# Patient Record
Sex: Female | Born: 1970 | Race: White | Hispanic: No | Marital: Married | Smoking: Never smoker
Health system: Southern US, Community
[De-identification: ages and names within clinical notes are randomized; demographics above are authoritative.]

## PROBLEM LIST (undated history)

## (undated) DIAGNOSIS — R197 Diarrhea, unspecified: Secondary | ICD-10-CM

## (undated) DIAGNOSIS — D649 Anemia, unspecified: Secondary | ICD-10-CM

## (undated) DIAGNOSIS — R51 Headache: Secondary | ICD-10-CM

## (undated) DIAGNOSIS — F329 Major depressive disorder, single episode, unspecified: Secondary | ICD-10-CM

## (undated) DIAGNOSIS — G479 Sleep disorder, unspecified: Secondary | ICD-10-CM

## (undated) DIAGNOSIS — E039 Hypothyroidism, unspecified: Secondary | ICD-10-CM

## (undated) DIAGNOSIS — E538 Deficiency of other specified B group vitamins: Secondary | ICD-10-CM

## (undated) DIAGNOSIS — F32A Depression, unspecified: Secondary | ICD-10-CM

## (undated) DIAGNOSIS — F419 Anxiety disorder, unspecified: Secondary | ICD-10-CM

## (undated) DIAGNOSIS — R519 Headache, unspecified: Secondary | ICD-10-CM

## (undated) DIAGNOSIS — K589 Irritable bowel syndrome without diarrhea: Secondary | ICD-10-CM

## (undated) DIAGNOSIS — K219 Gastro-esophageal reflux disease without esophagitis: Secondary | ICD-10-CM

## (undated) DIAGNOSIS — E559 Vitamin D deficiency, unspecified: Secondary | ICD-10-CM

## (undated) DIAGNOSIS — E079 Disorder of thyroid, unspecified: Secondary | ICD-10-CM

## (undated) HISTORY — DX: Major depressive disorder, single episode, unspecified: F32.9

## (undated) HISTORY — DX: Gastro-esophageal reflux disease without esophagitis: K21.9

## (undated) HISTORY — PX: GASTRIC BYPASS: SHX52

## (undated) HISTORY — PX: COLPOSCOPY: SHX161

## (undated) HISTORY — PX: DILATION AND CURETTAGE OF UTERUS: SHX78

## (undated) HISTORY — DX: Anxiety disorder, unspecified: F41.9

## (undated) HISTORY — PX: ABDOMINAL HYSTERECTOMY: SHX81

## (undated) HISTORY — DX: Vitamin D deficiency, unspecified: E55.9

## (undated) HISTORY — PX: CHOLECYSTECTOMY: SHX55

## (undated) HISTORY — DX: Depression, unspecified: F32.A

## (undated) HISTORY — PX: ESOPHAGOGASTRODUODENOSCOPY ENDOSCOPY: SHX5814

---

## 2006-09-25 ENCOUNTER — Ambulatory Visit: Payer: Self-pay | Admitting: Family Medicine

## 2007-02-27 ENCOUNTER — Emergency Department: Payer: Self-pay | Admitting: Emergency Medicine

## 2007-02-27 ENCOUNTER — Other Ambulatory Visit: Payer: Self-pay

## 2007-02-27 IMAGING — CT CT HEAD WITHOUT CONTRAST
2 series · 16 of 30 positions shown, 20 images · non-contrast
Comparison: none

REASON FOR EXAM: weak,numb all over
COMMENTS:

[Series 2: without · axial · non-contrast · 0.42mm/px · z∈[-162,-42]mm · 13 of 28 slices shown, 17 images]
[im 2/28  brain]
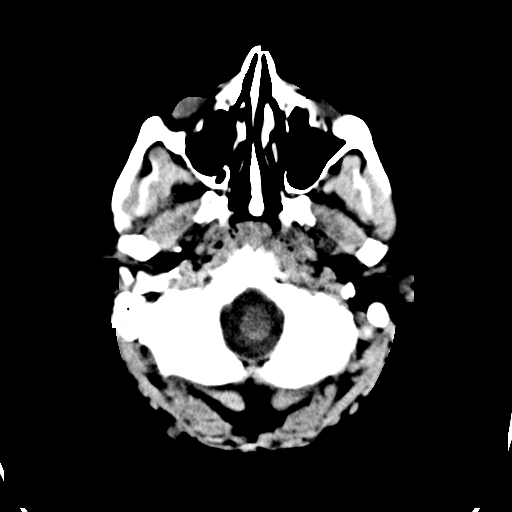
[im 2/28  bone]
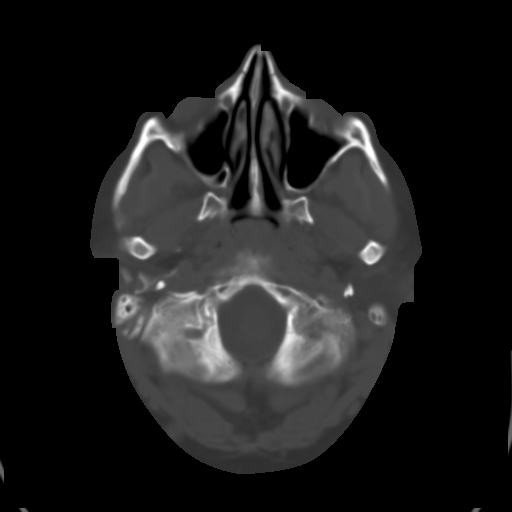
[im 4/28  brain]
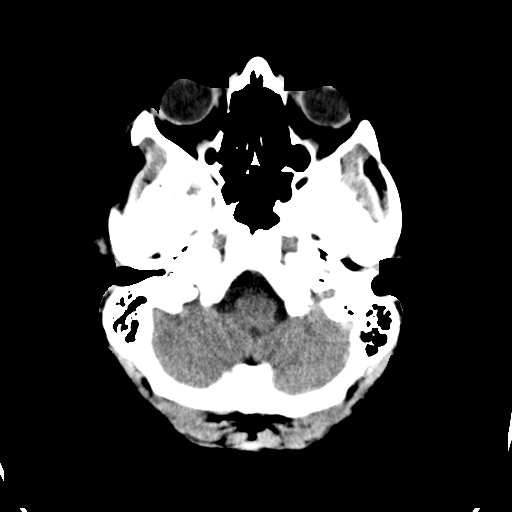
[im 6/28  brain]
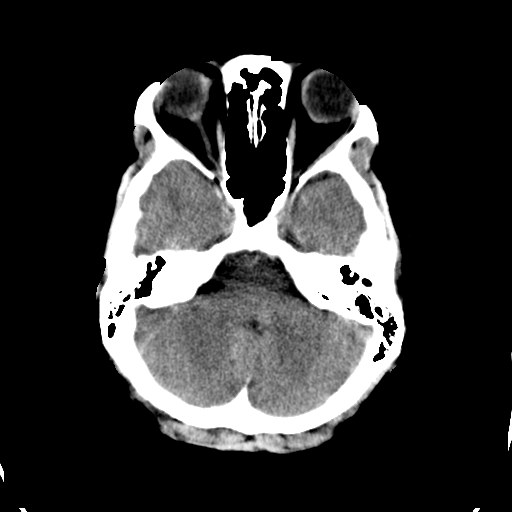
[im 8/28  brain]
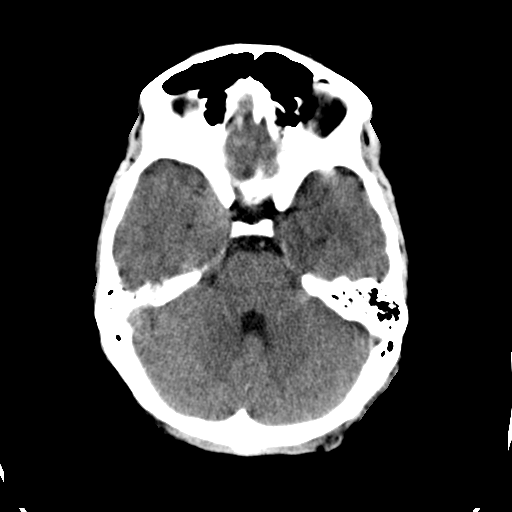
[im 10/28  brain]
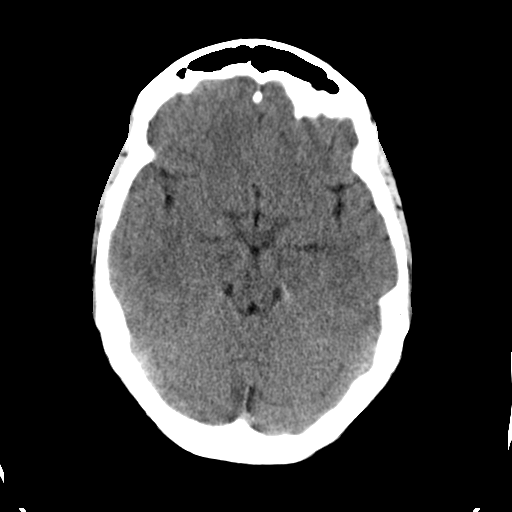
[im 10/28  bone]
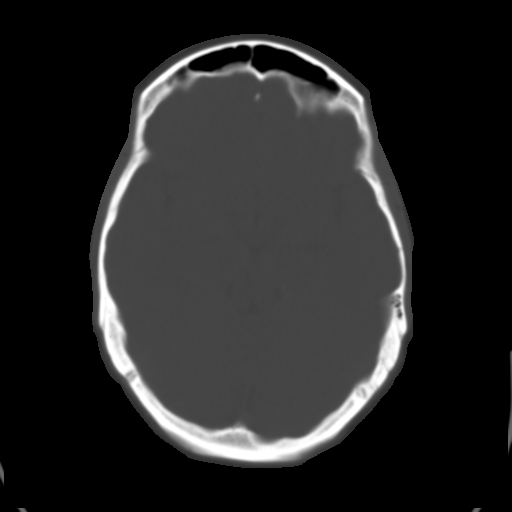
[im 12/28  brain]
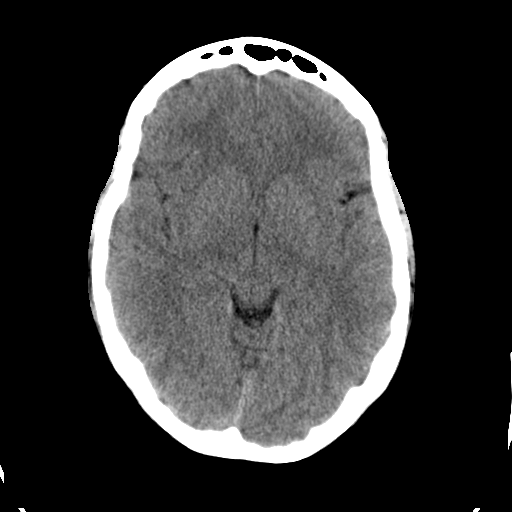
[im 14/28  brain]
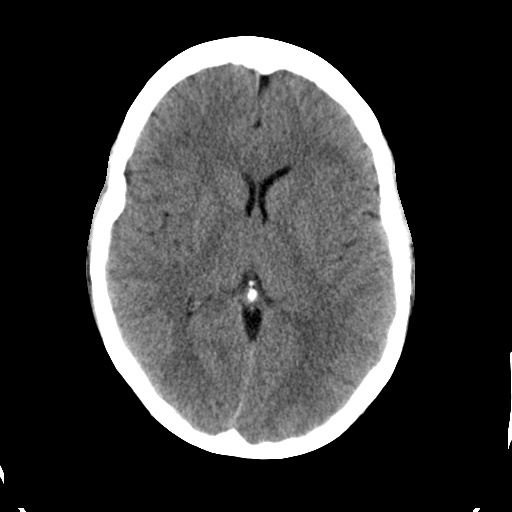
[im 16/28  brain]
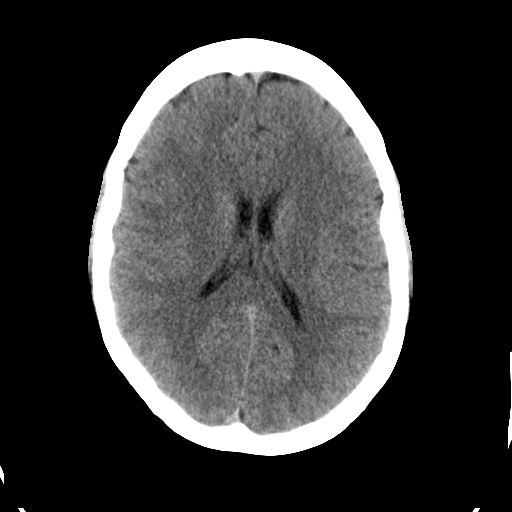
[im 18/28  brain]
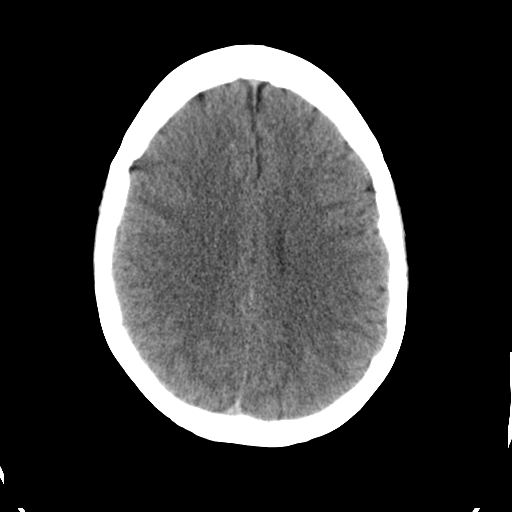
[im 18/28  bone]
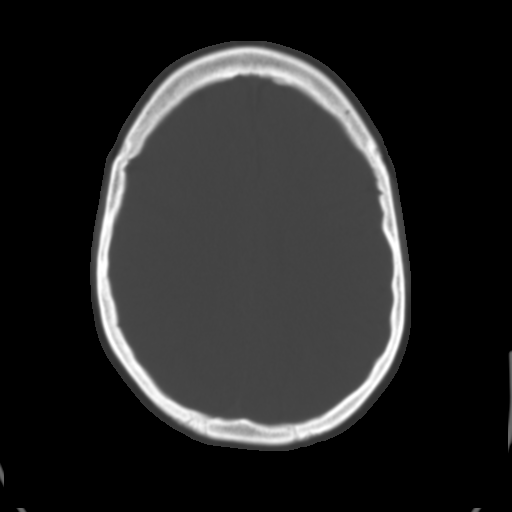
[im 20/28  brain]
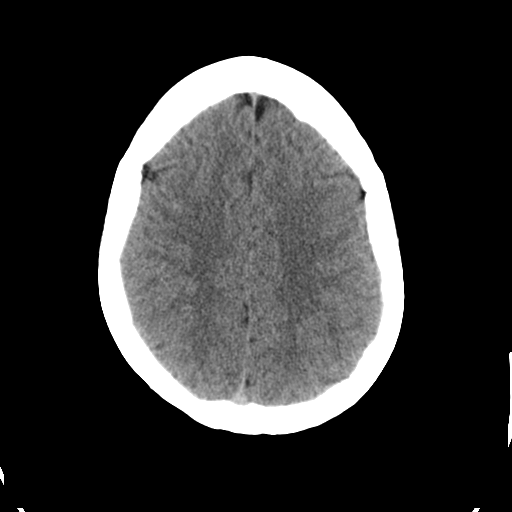
[im 22/28  brain]
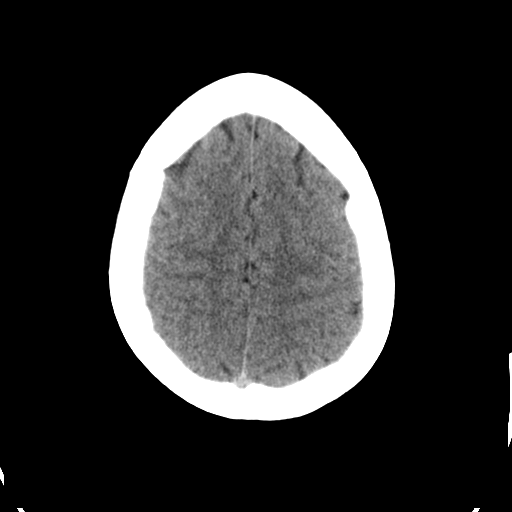
[im 24/28  brain]
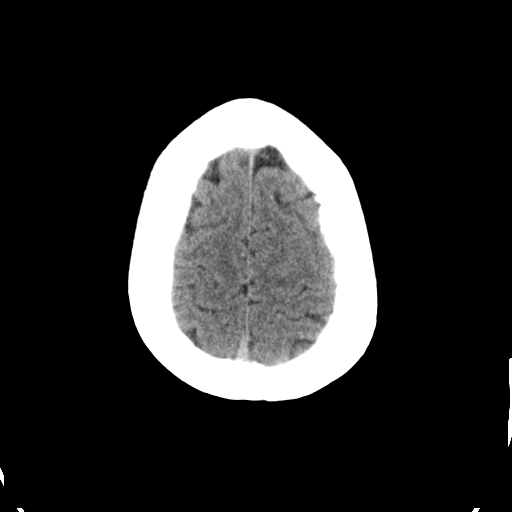
[im 26/28  brain]
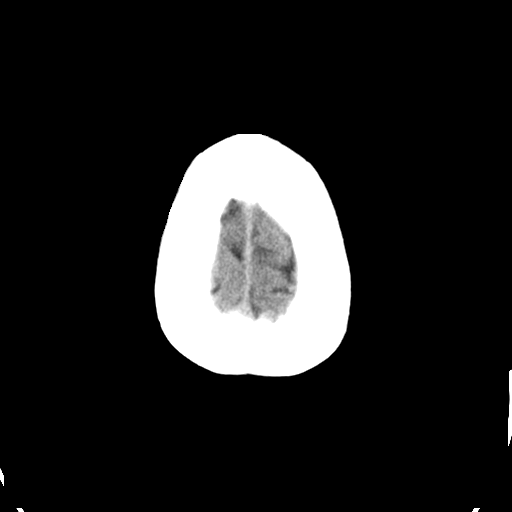
[im 26/28  bone]
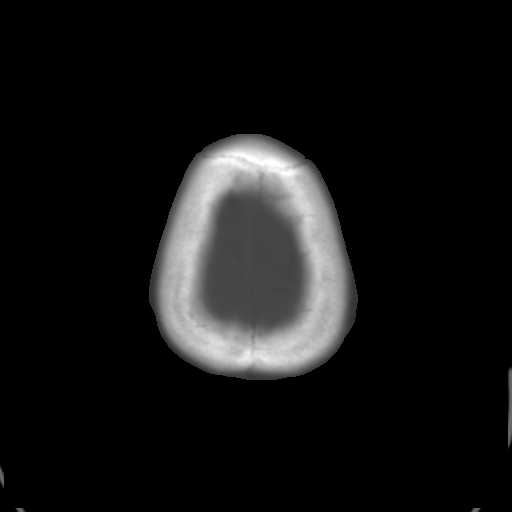

[Series 3: bone · axial · 0.42mm/px · z∈[-162,-122]mm · 3 of 28 slices shown]
[im 2/28  bone]
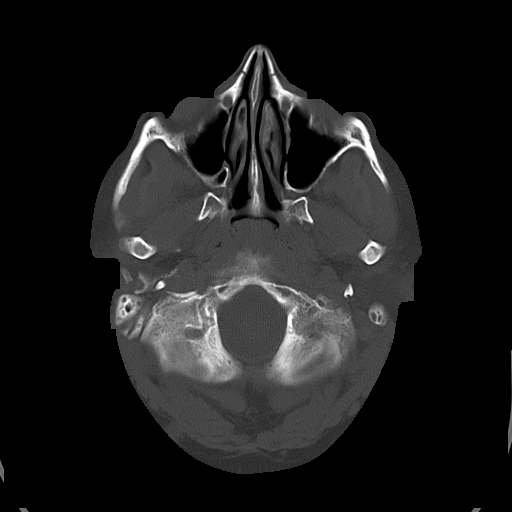
[im 6/28  bone]
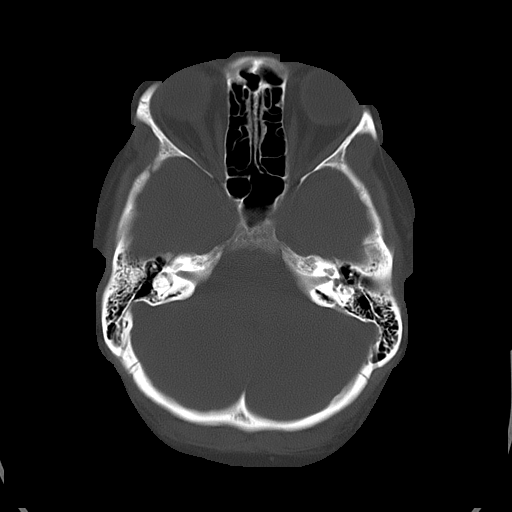
[im 10/28  bone]
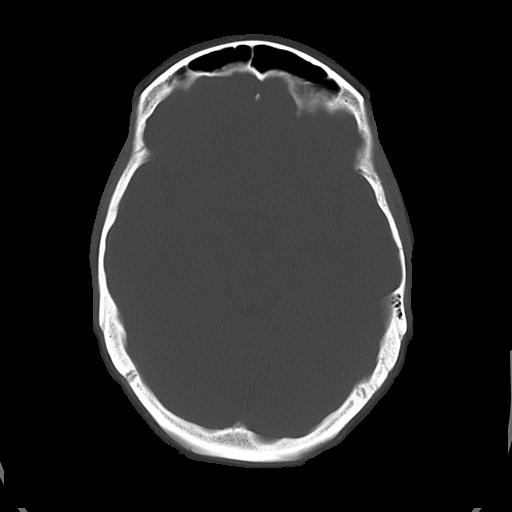

[16 of 30 positions shown; findings below may reference images not displayed]

PROCEDURE:     CT  - CT HEAD WITHOUT CONTRAST  - February 27, 2007 [DATE]

RESULT:     The ventricles are normal in size and position. There is no
intracranial hemorrhage, mass, or mass-effect. There are no findings to
suggest an evolving ischemic infarction. At bone window settings the
observed portions of the paranasal sinuses are clear. No lytic or blastic
bony lesions are seen.
IMPRESSION: I see no acute intracranial abnormality.

A preliminary report was sent to the [HOSPITAL] the conclusion
of the study.

## 2007-02-27 IMAGING — CR DG CHEST 2V
1 series · 2 of 2 positions shown · non-contrast
Comparison: none

REASON FOR EXAM: sob
COMMENTS:

PROCEDURE:     DXR - DXR CHEST PA (OR AP) AND LATERAL  - February 27, 2007 [DATE]
RESULT:     The lung fields are clear. The heart, mediastinal and osseous
structures are normal in appearance.

[Series 1: view not recorded · 0.17mm/px · 2 of 2 slices shown]
[im 1/2]
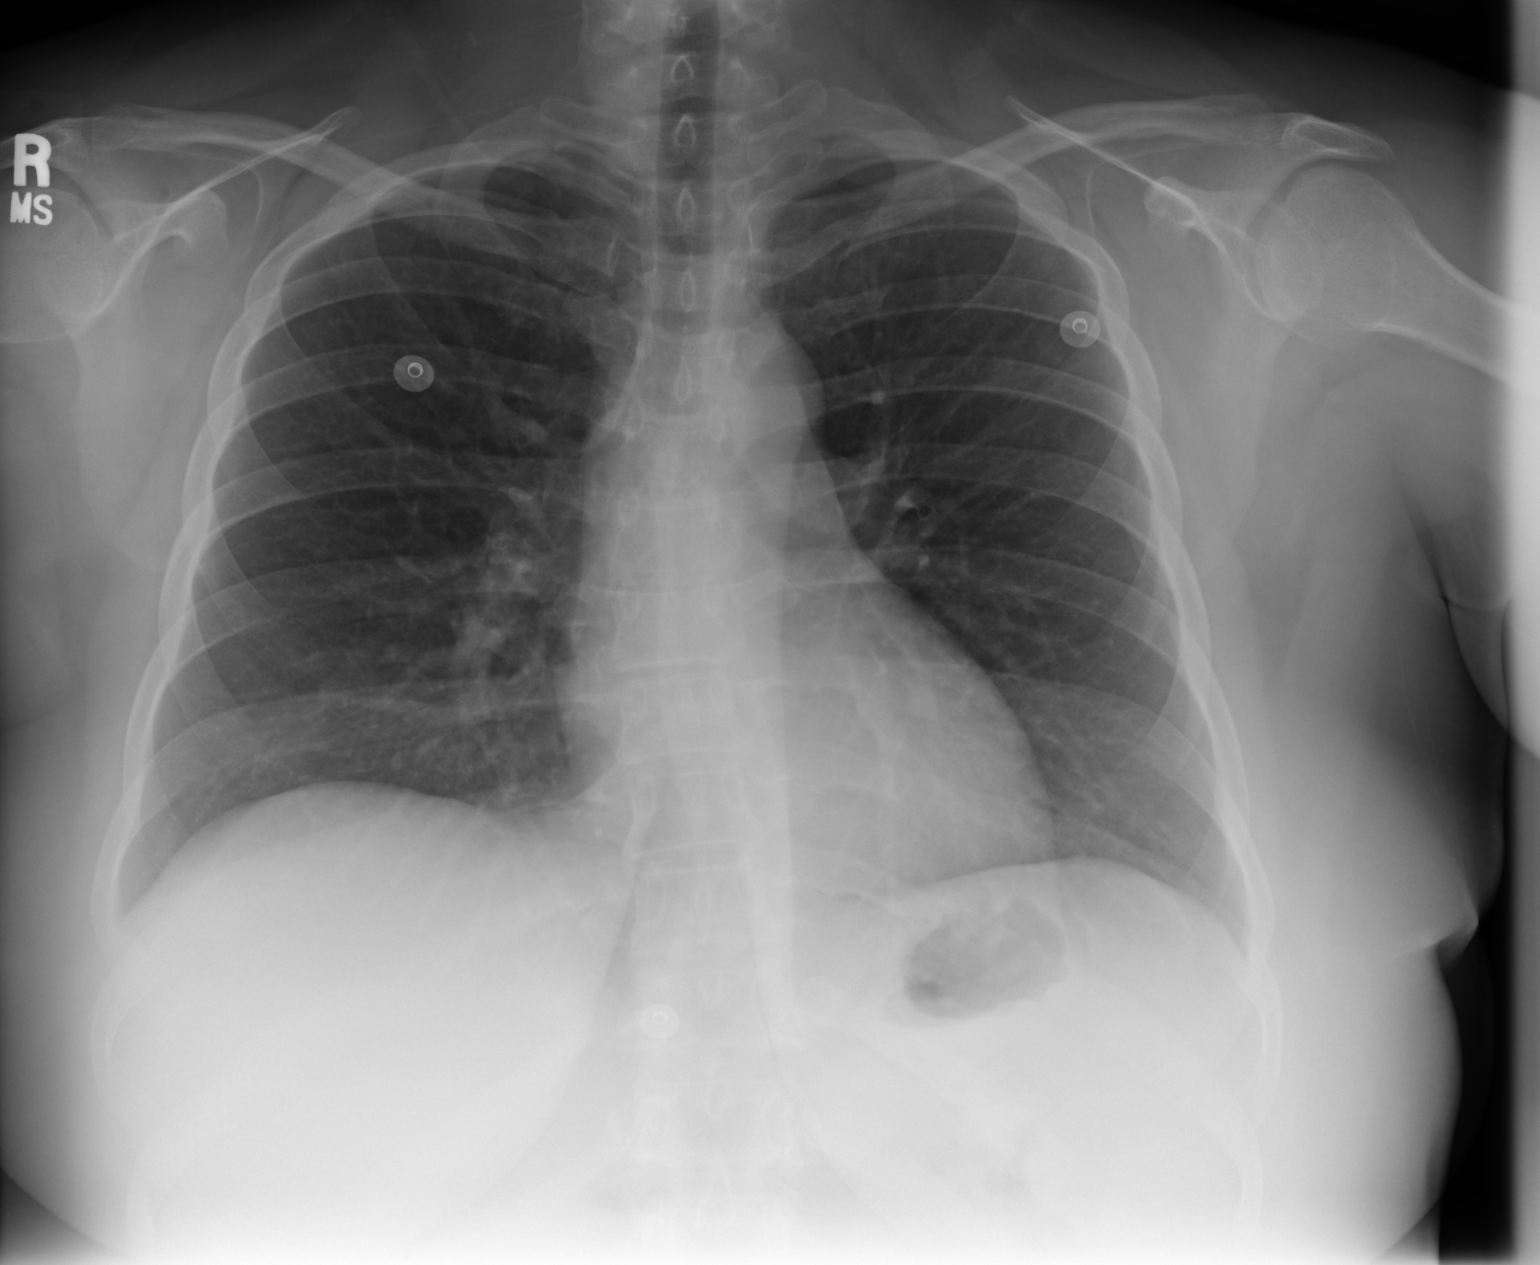
[im 2/2]
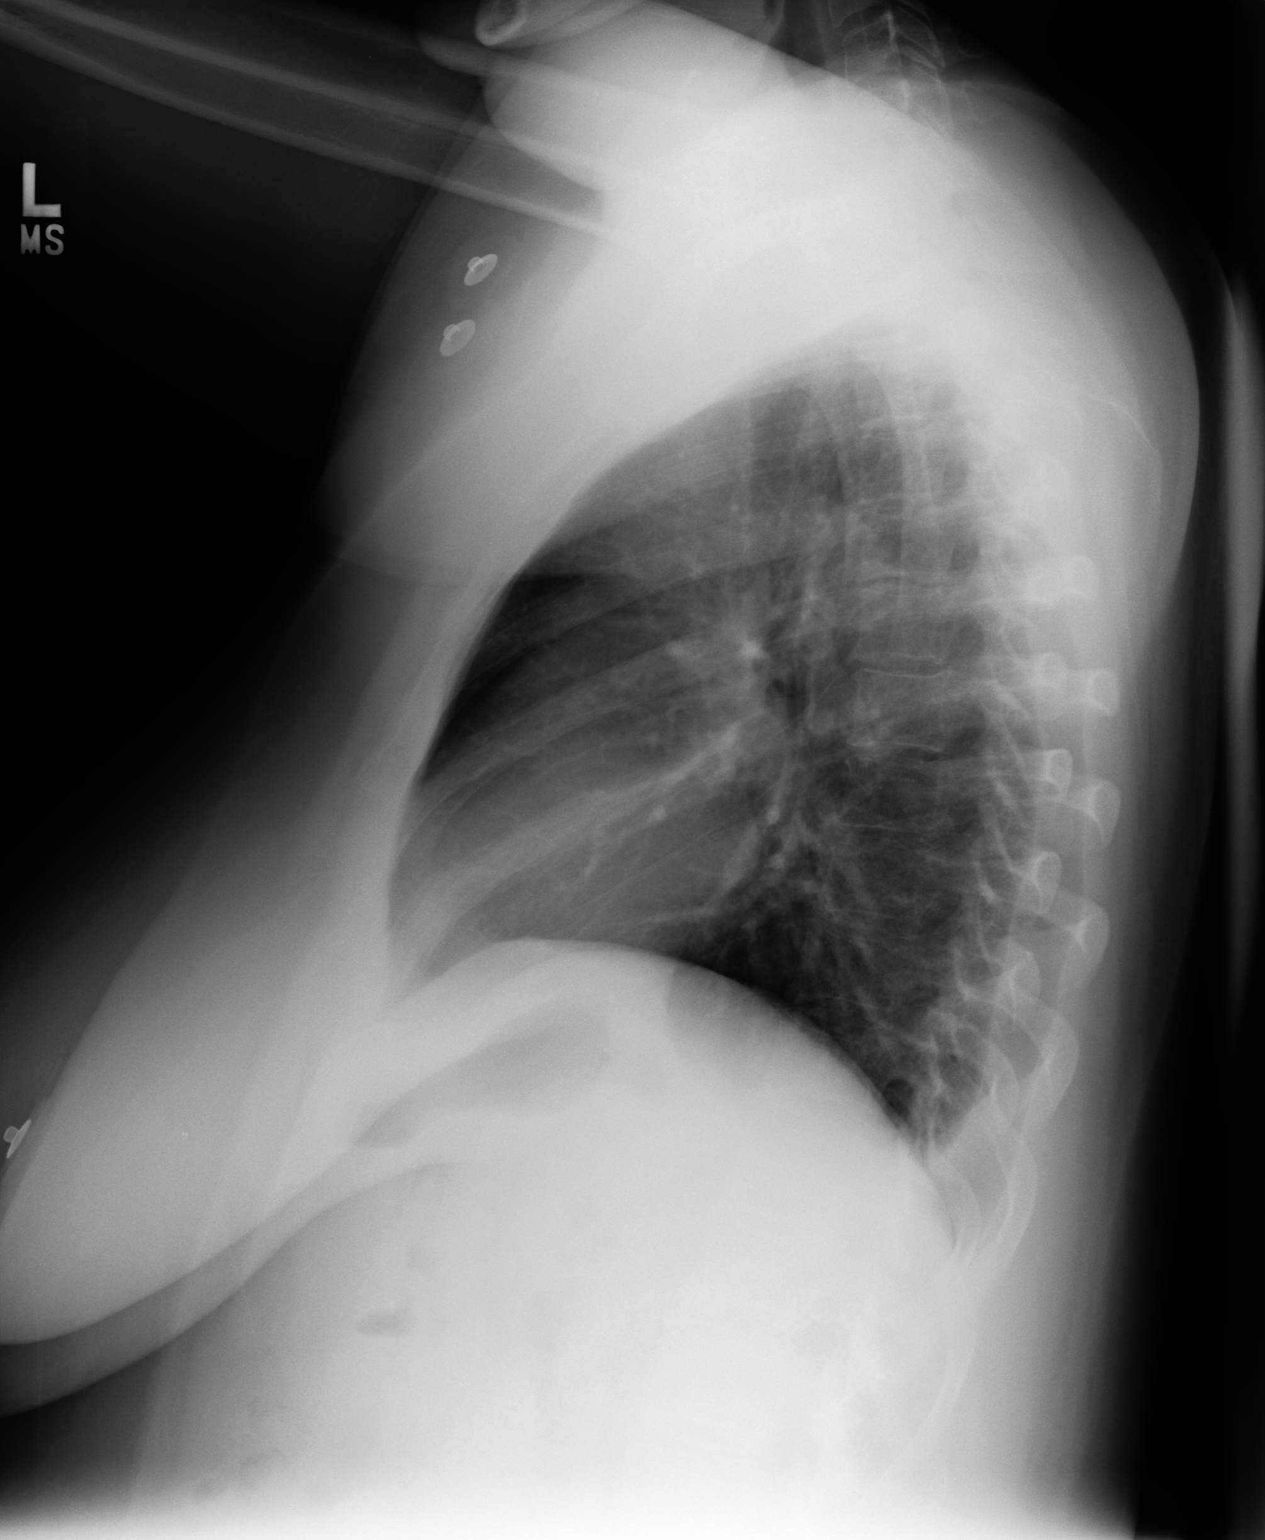

[2 of 2 positions shown; findings below may reference images not displayed]

IMPRESSION: 1.     No significant abnormalities are noted.

## 2007-11-14 ENCOUNTER — Emergency Department: Payer: Self-pay | Admitting: Emergency Medicine

## 2007-11-14 ENCOUNTER — Ambulatory Visit: Payer: Self-pay | Admitting: Unknown Physician Specialty

## 2007-11-14 IMAGING — CT CT ABD-PELV W/ CM
1 of 3 series · 12 of 32 positions shown, 18 images · non-contrast
Comparison: none

REASON FOR EXAM: RLQ and LLQ pain, diarrhea, fatigue
COMMENTS:

[Series 2: appendicitis · axial · 0.95mm/px · z∈[-512,-89]mm · 12 of 167 slices shown, 18 images]
[im 13/167  soft-tissue]
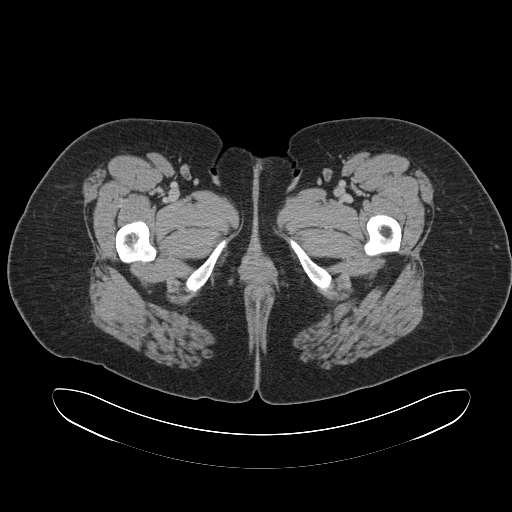
[im 13/167  bone]
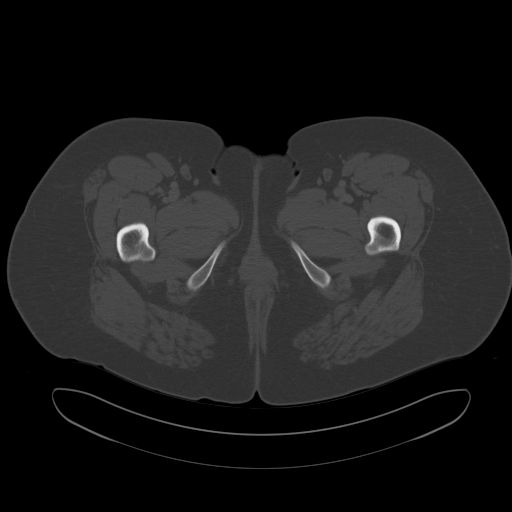
[im 26/167  soft-tissue]
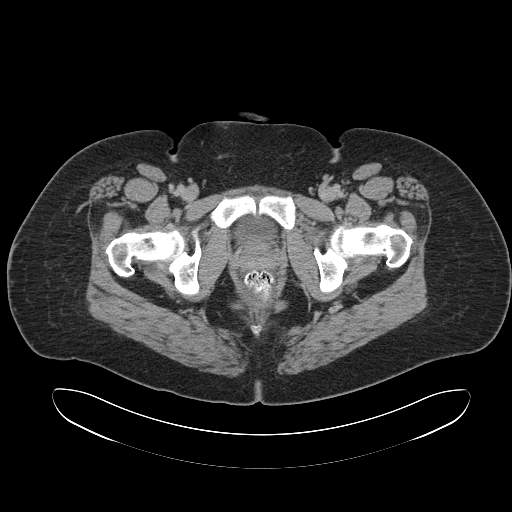
[im 39/167  soft-tissue]
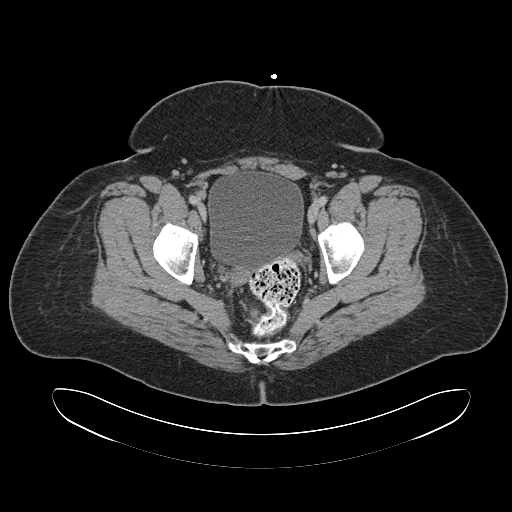
[im 52/167  soft-tissue]
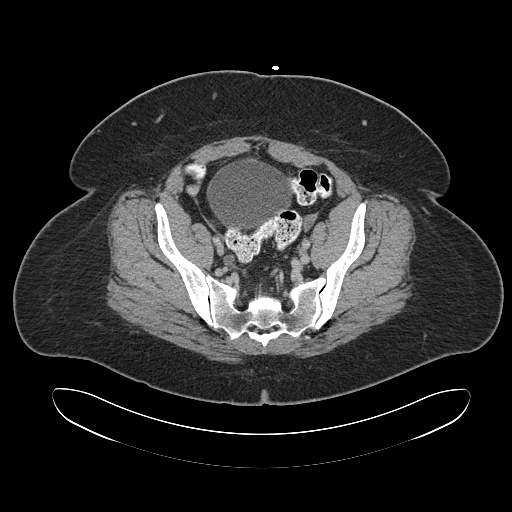
[im 64/167  soft-tissue]
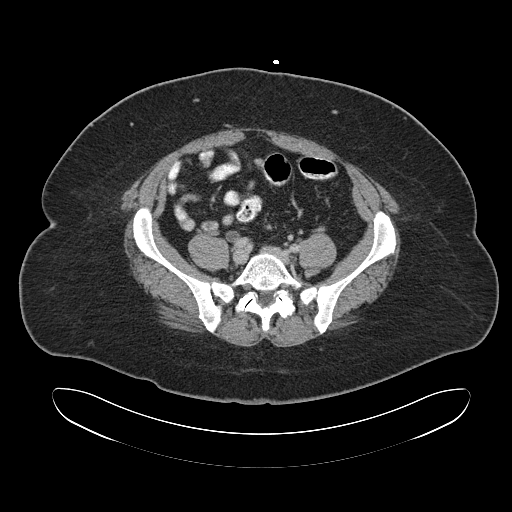
[im 77/167  soft-tissue]
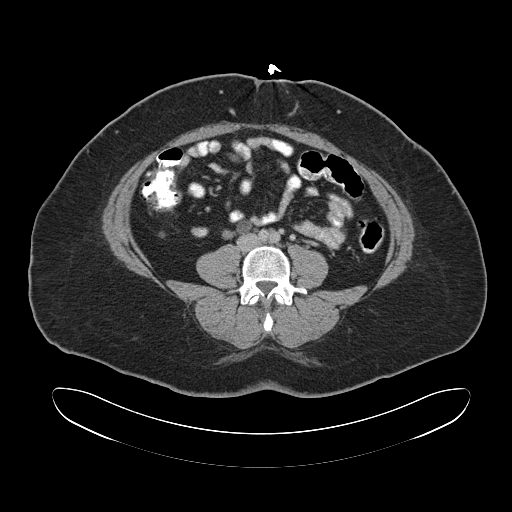
[im 90/167  soft-tissue]
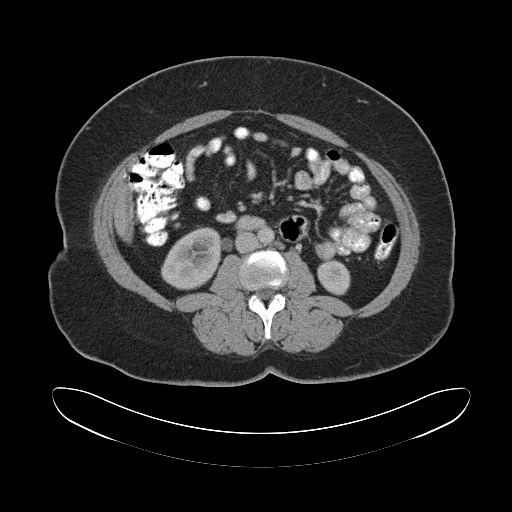
[im 103/167  soft-tissue]
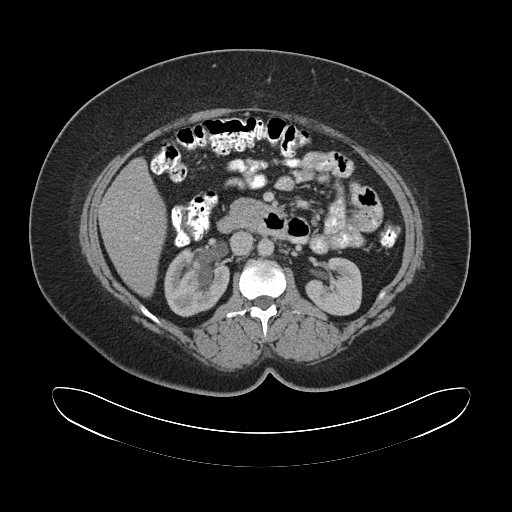
[im 115/167  soft-tissue]
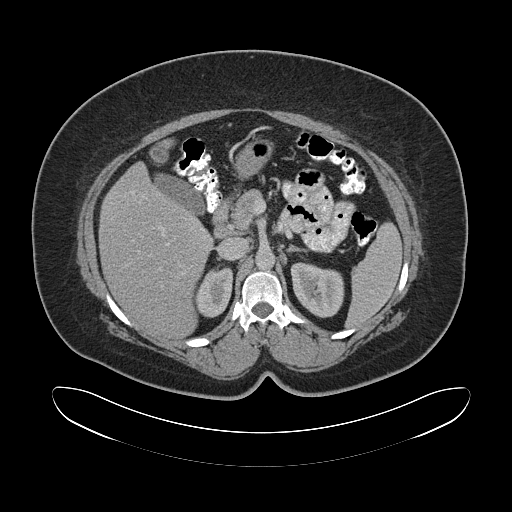
[im 115/167  lung]
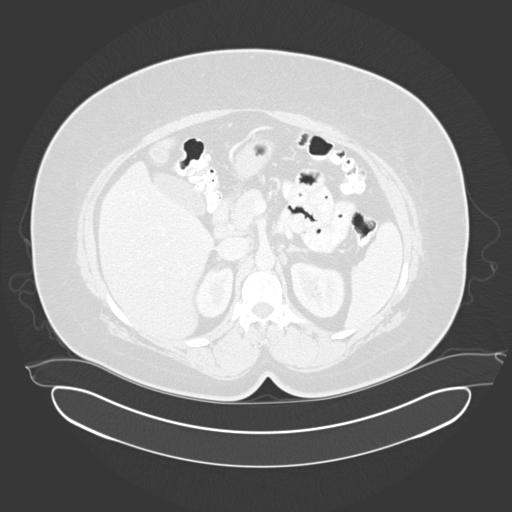
[im 115/167  bone]
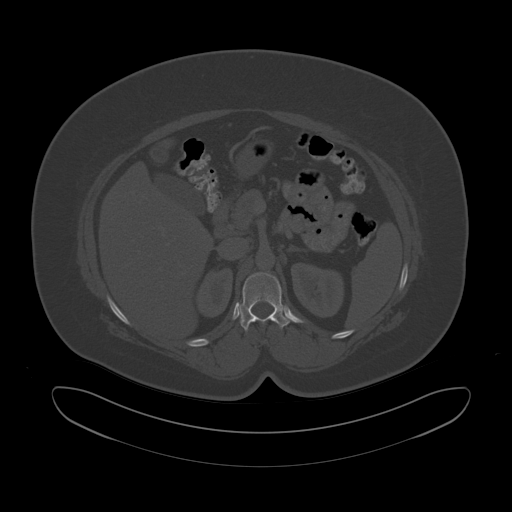
[im 128/167  soft-tissue]
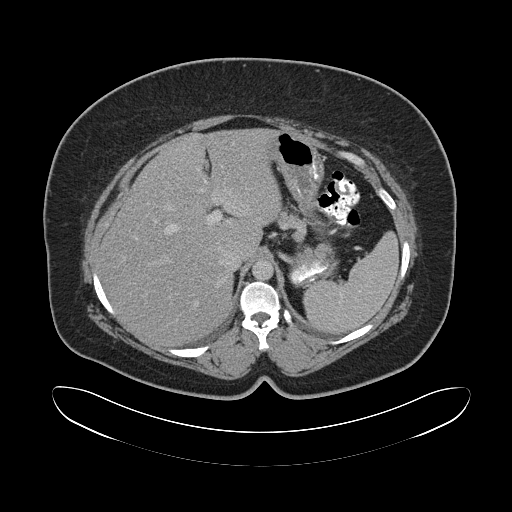
[im 128/167  lung]
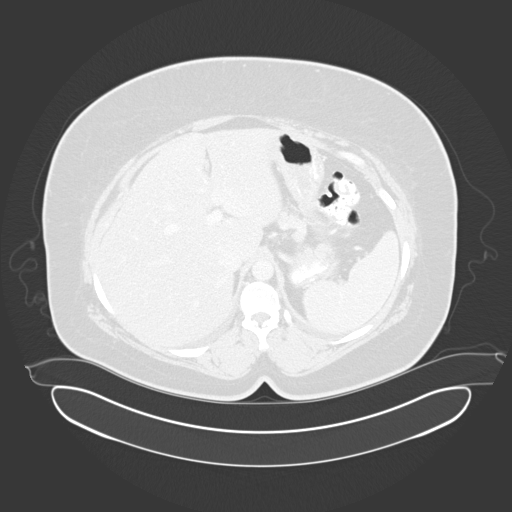
[im 141/167  soft-tissue]
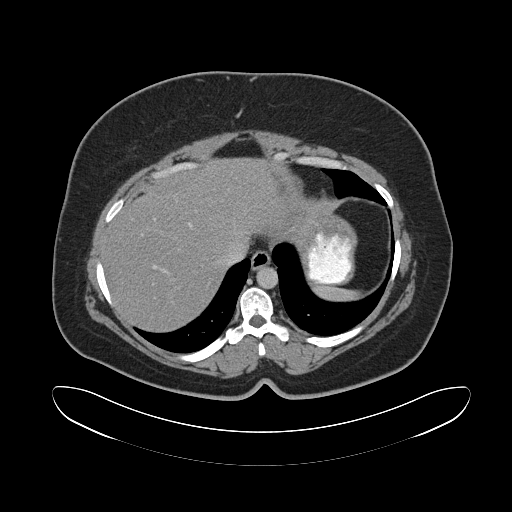
[im 141/167  lung]
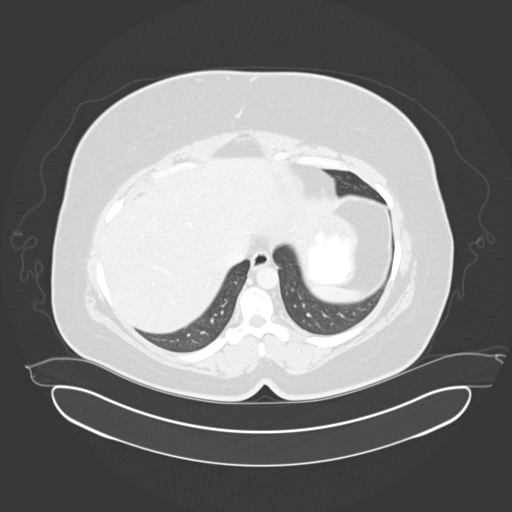
[im 154/167  soft-tissue]
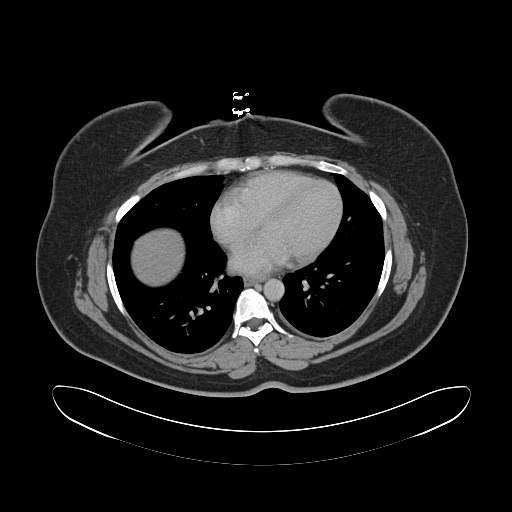
[im 154/167  lung]
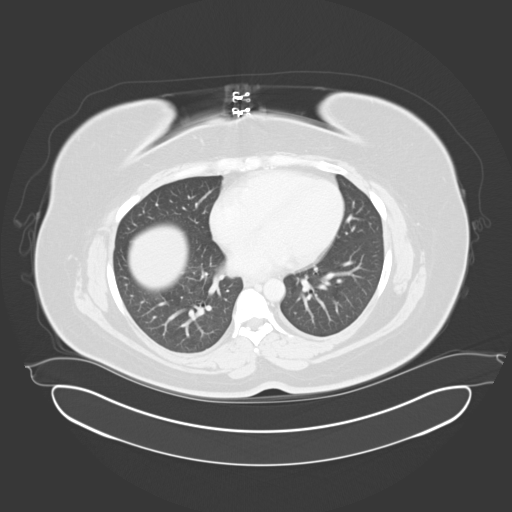

[12 of 32 positions shown; findings below may reference images not displayed]

PROCEDURE:     CT  - CT ABDOMEN / PELVIS  W  - November 14, 2007 [DATE]

RESULT:     The patient is complaining of RIGHT back discomfort and has
elevated white blood cell count.

Following administration of the IV contrast, consisting of 85 ml of
Ssovue-YGL, the patient complained of itching of the ears and of the throat.
The patient remained conscious and alert. Initial blood pressure
determination was 138/90 with a pulse of approximately 80. The patient was
transported to the Emergency Department for observation and possible
treatment.

There is hydronephrosis and hydroureter on the RIGHT which appears to be
related to a large stone near the ureterovesical junction measuring
approximately 5.0 x 6.0 mm. It is demonstrated best on image #124. This may
be a group of smaller stones that have lodged here. The delayed images
reveal the dilated intrarenal collecting system to best advantage. On the
LEFT, there is no evidence of obstruction. The partially distended urinary
bladder is grossly normal.

Elsewhere within the abdomen, there is radiodense material within the
gallbladder which likely reflects sludge or noncalcified stones. The liver,
spleen, stomach, pancreas and adrenal glands exhibit no acute abnormality.
The caliber of the abdominal aorta is normal. The lung bases are clear. The
uterus is apparently surgically absent. There are no adnexal masses. The
partially contrast filled loops of small and large bowel are normal in
appearance. The appendix is demonstrated and appears normal.
IMPRESSION: 1.  There is moderate hydronephrosis and hydroureter secondary to an
approximately 5.0 x 6.0 mm in diameter stone approximately 1.0 cm proximal
to the ureterovesical junction on the RIGHT.  No abnormality of the LEFT
kidney is identified. No other stones are demonstrated on the RIGHT.
2.  There are likely gallstones present.
3.  I see no acute abnormality elsewhere within the abdomen or pelvis.
4.  Please note the patient did have a contrast reaction and in the future
will need to undergo premedication.
5.  Urologic consultation is recommended.

The findings were discussed with Dr. Dontae in the Emergency Department who
is currently caring for the patient. A brief report was also given to Dr.
Edrian Sorbito.

## 2007-11-19 ENCOUNTER — Ambulatory Visit: Payer: Self-pay | Admitting: Urology

## 2007-11-19 IMAGING — CR DG ABDOMEN 1V
1 series · 1 of 1 positions shown · non-contrast
Comparison: none

REASON FOR EXAM: nephrolithiasis pt need films
COMMENTS:

PROCEDURE:     DXR - DXR KIDNEY URETER BLADDER  - November 19, 2007  [DATE]
RESULT:     A single image demonstrates an unremarkable bowel gas pattern.
No definite stones are identified. The bony structures are unremarkable.

[view not recorded]
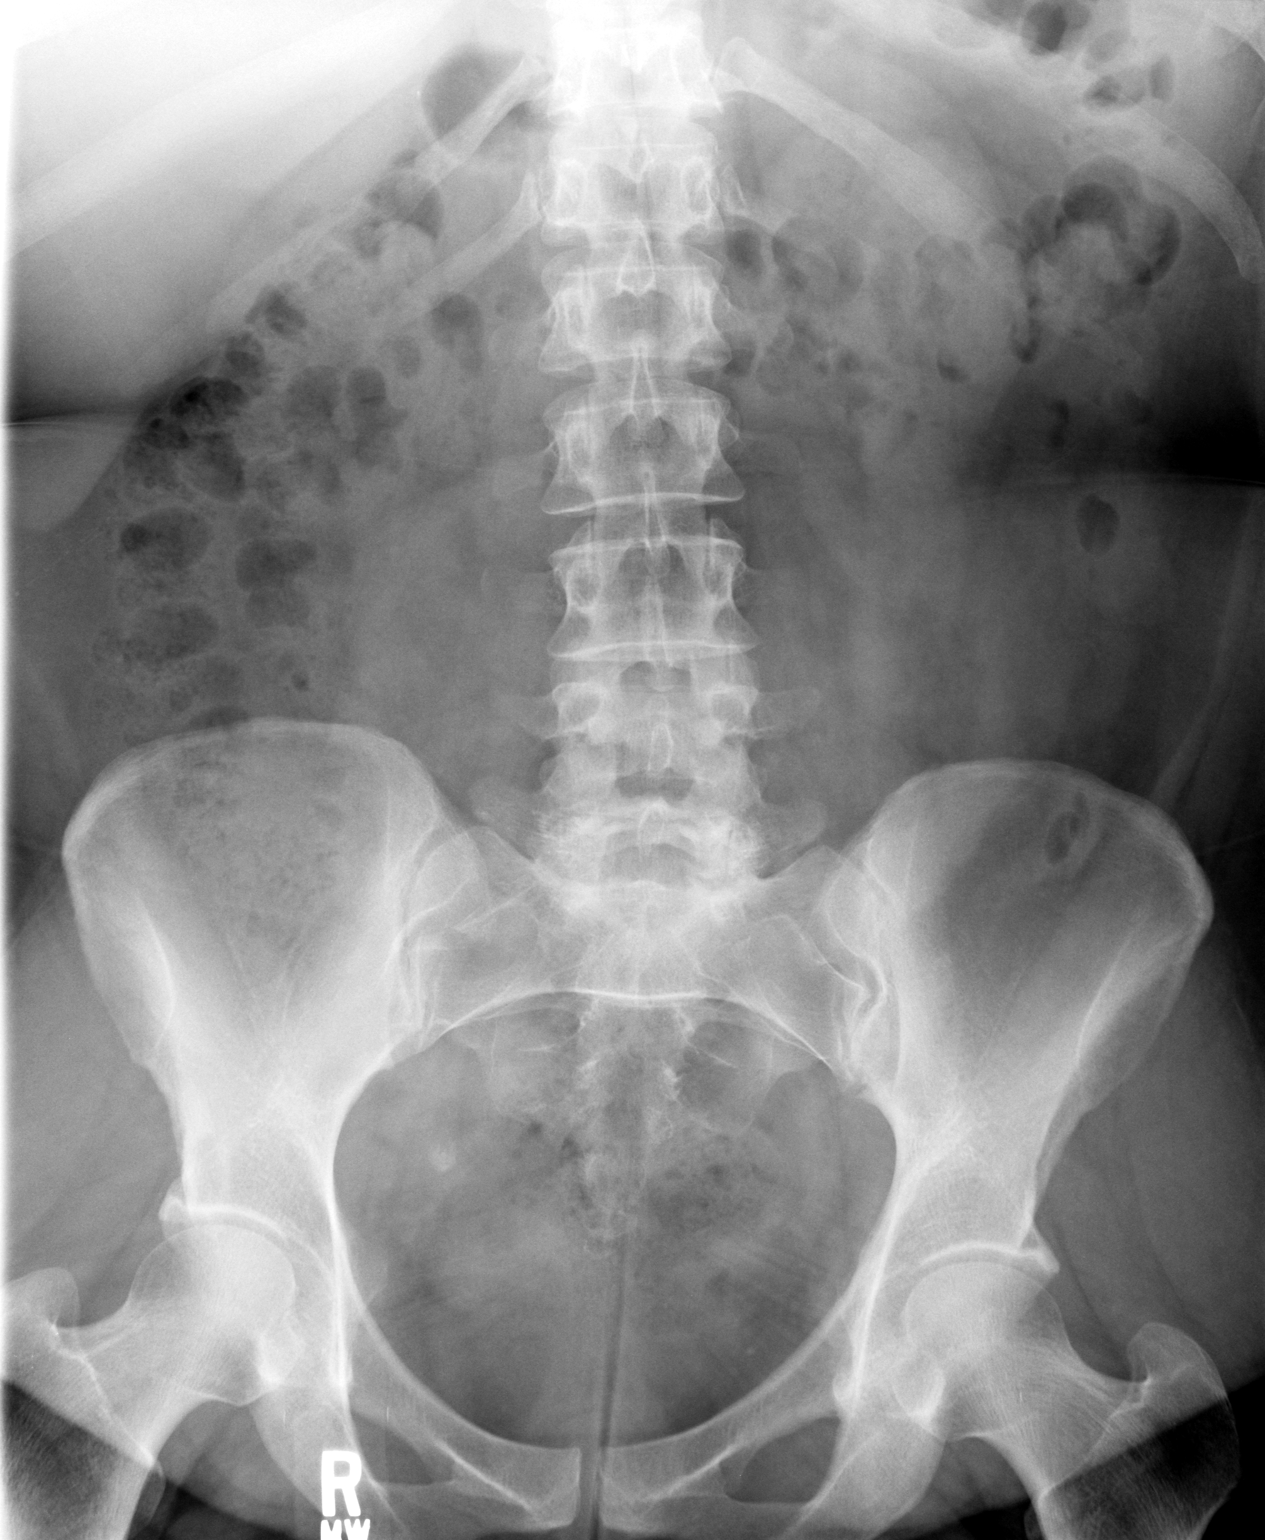

[1 of 1 positions shown; findings below may reference images not displayed]

IMPRESSION: No definite urinary calculi. No evidence of abnormal bowel distention or
obstruction.

## 2007-11-21 ENCOUNTER — Ambulatory Visit: Payer: Self-pay | Admitting: Urology

## 2007-11-21 IMAGING — CR DG ABDOMEN 1V
1 series · 1 of 1 positions shown · non-contrast
Comparison: none

REASON FOR EXAM: renal calculi-lithotripsy
COMMENTS:

[view not recorded]
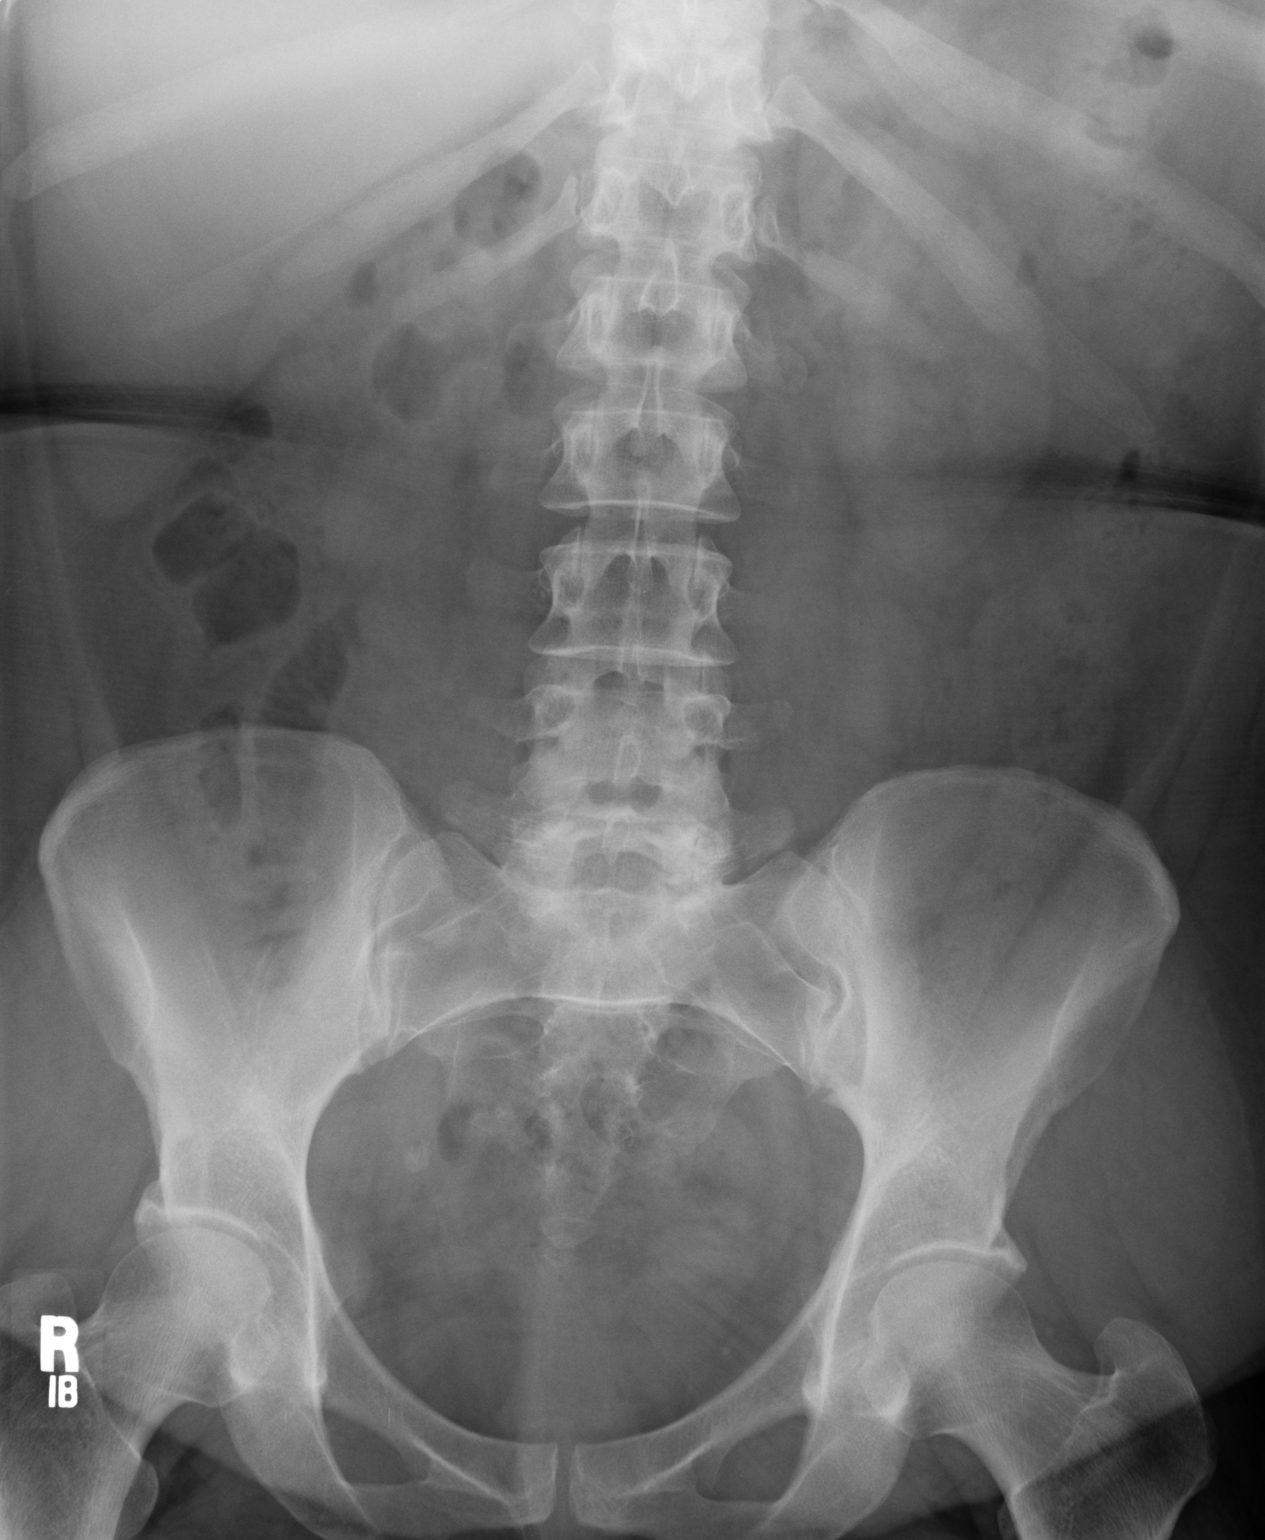

[1 of 1 positions shown; findings below may reference images not displayed]

PROCEDURE:     DXR - DXR KIDNEY URETER BLADDER  - November 21, 2007 [DATE]

RESULT:     On a CT scan dated 14 November, 2007, a ureterovesical junction stone
was identified. This calcific density is seen on today's KUB and appears
unchanged from the study [DATE]. I do not see definite stones elsewhere
on the RIGHT nor on the LEFT.
IMPRESSION: There is a persistent irregular stone in the region of ureterovesical
junction on the RIGHT which measures approximately 6 mm in diameter. Further
interpretation is deferred to Dr. Carreira Barroso Alfredo Esch.

## 2007-11-22 ENCOUNTER — Ambulatory Visit: Payer: Self-pay | Admitting: Urology

## 2007-11-22 IMAGING — CR DG ABDOMEN 1V
1 series · 1 of 1 positions shown · non-contrast
Comparison: none

REASON FOR EXAM: nephrolithiasis, patient needs films
COMMENTS:

[view not recorded]
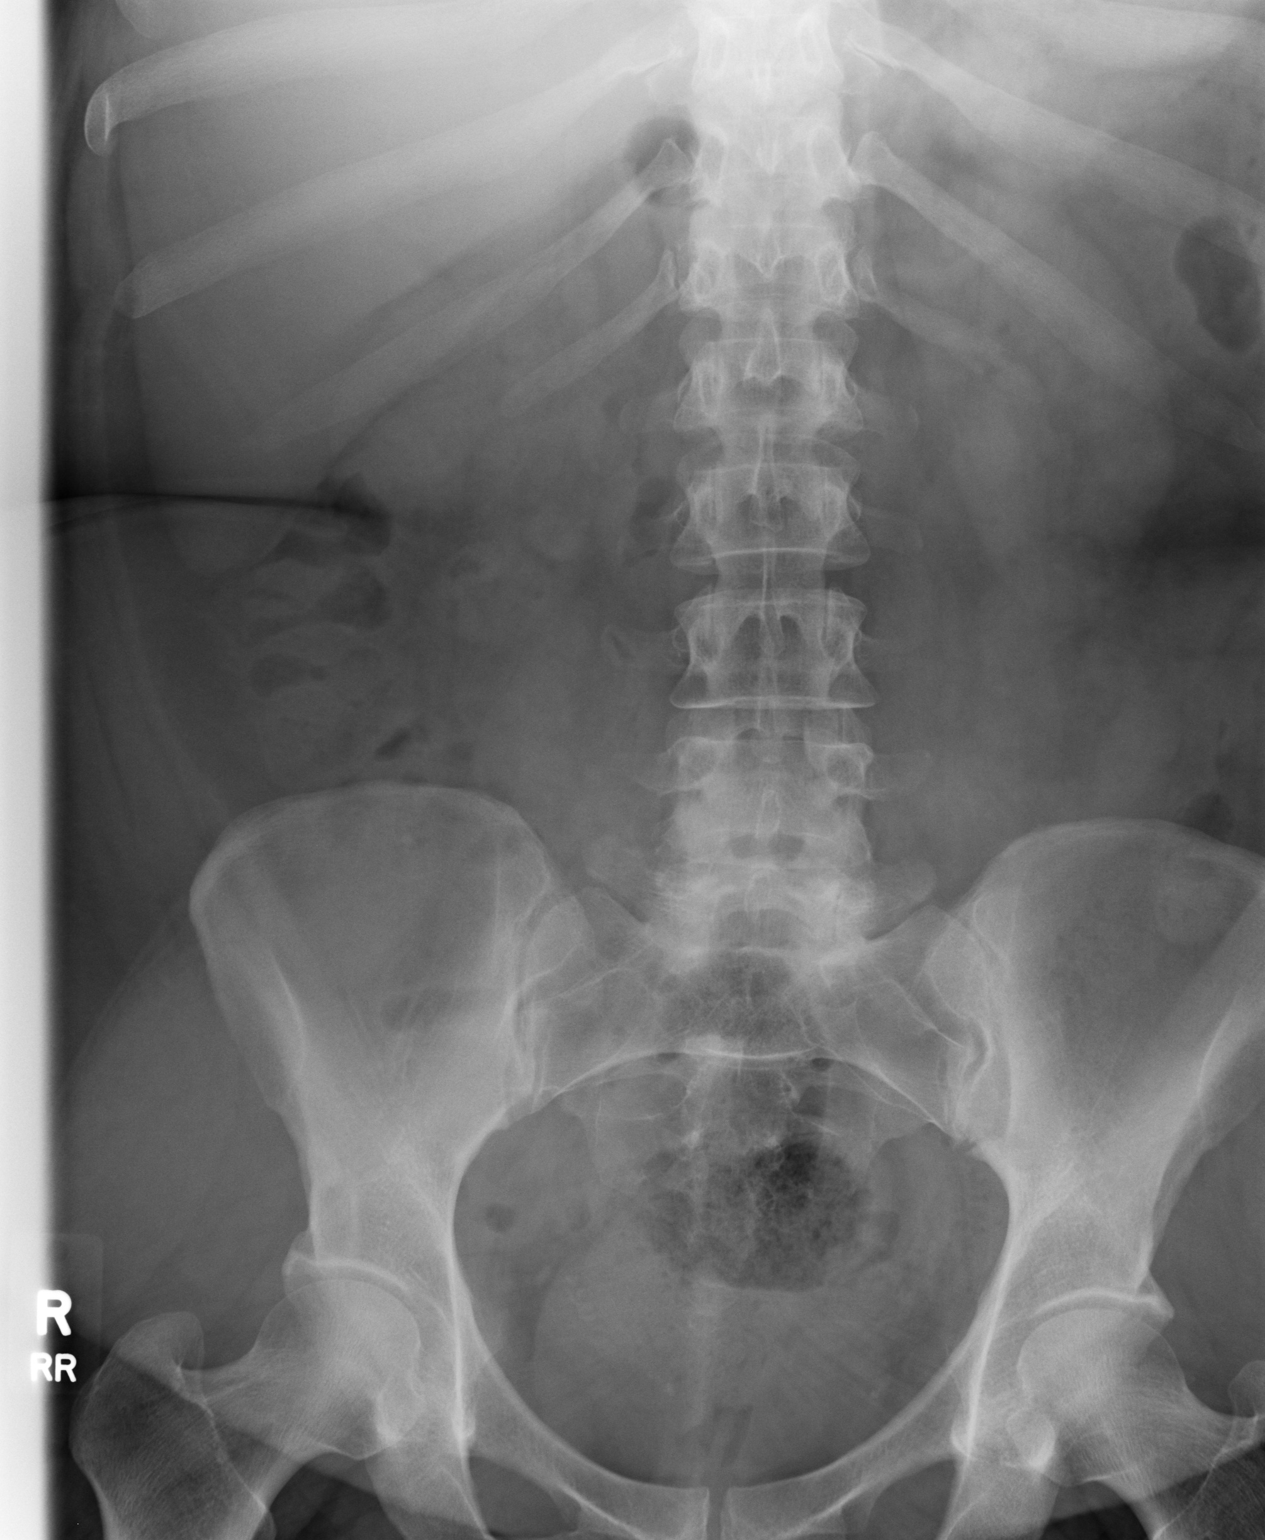

[1 of 1 positions shown; findings below may reference images not displayed]

PROCEDURE:     DXR - DXR KIDNEY URETER BLADDER  - November 22, 2007 [DATE]

RESULT:     Comparison is made to the exam dated 11/21/2007.

No definite renal stones are evident. The bowel gas pattern is unremarkable.
Irregular density is seen in the RIGHT ureterovesical region which could
represent residual stone or stone fragments.
IMPRESSION: Please see above.

## 2007-12-05 ENCOUNTER — Ambulatory Visit: Payer: Self-pay | Admitting: Urology

## 2007-12-05 IMAGING — CR DG ABDOMEN 1V
1 series · 1 of 1 positions shown · non-contrast
Comparison: none

REASON FOR EXAM: NEPHROLITHIASIS, PLEASE GIVE FILM TO PATIENT
COMMENTS:

[view not recorded]
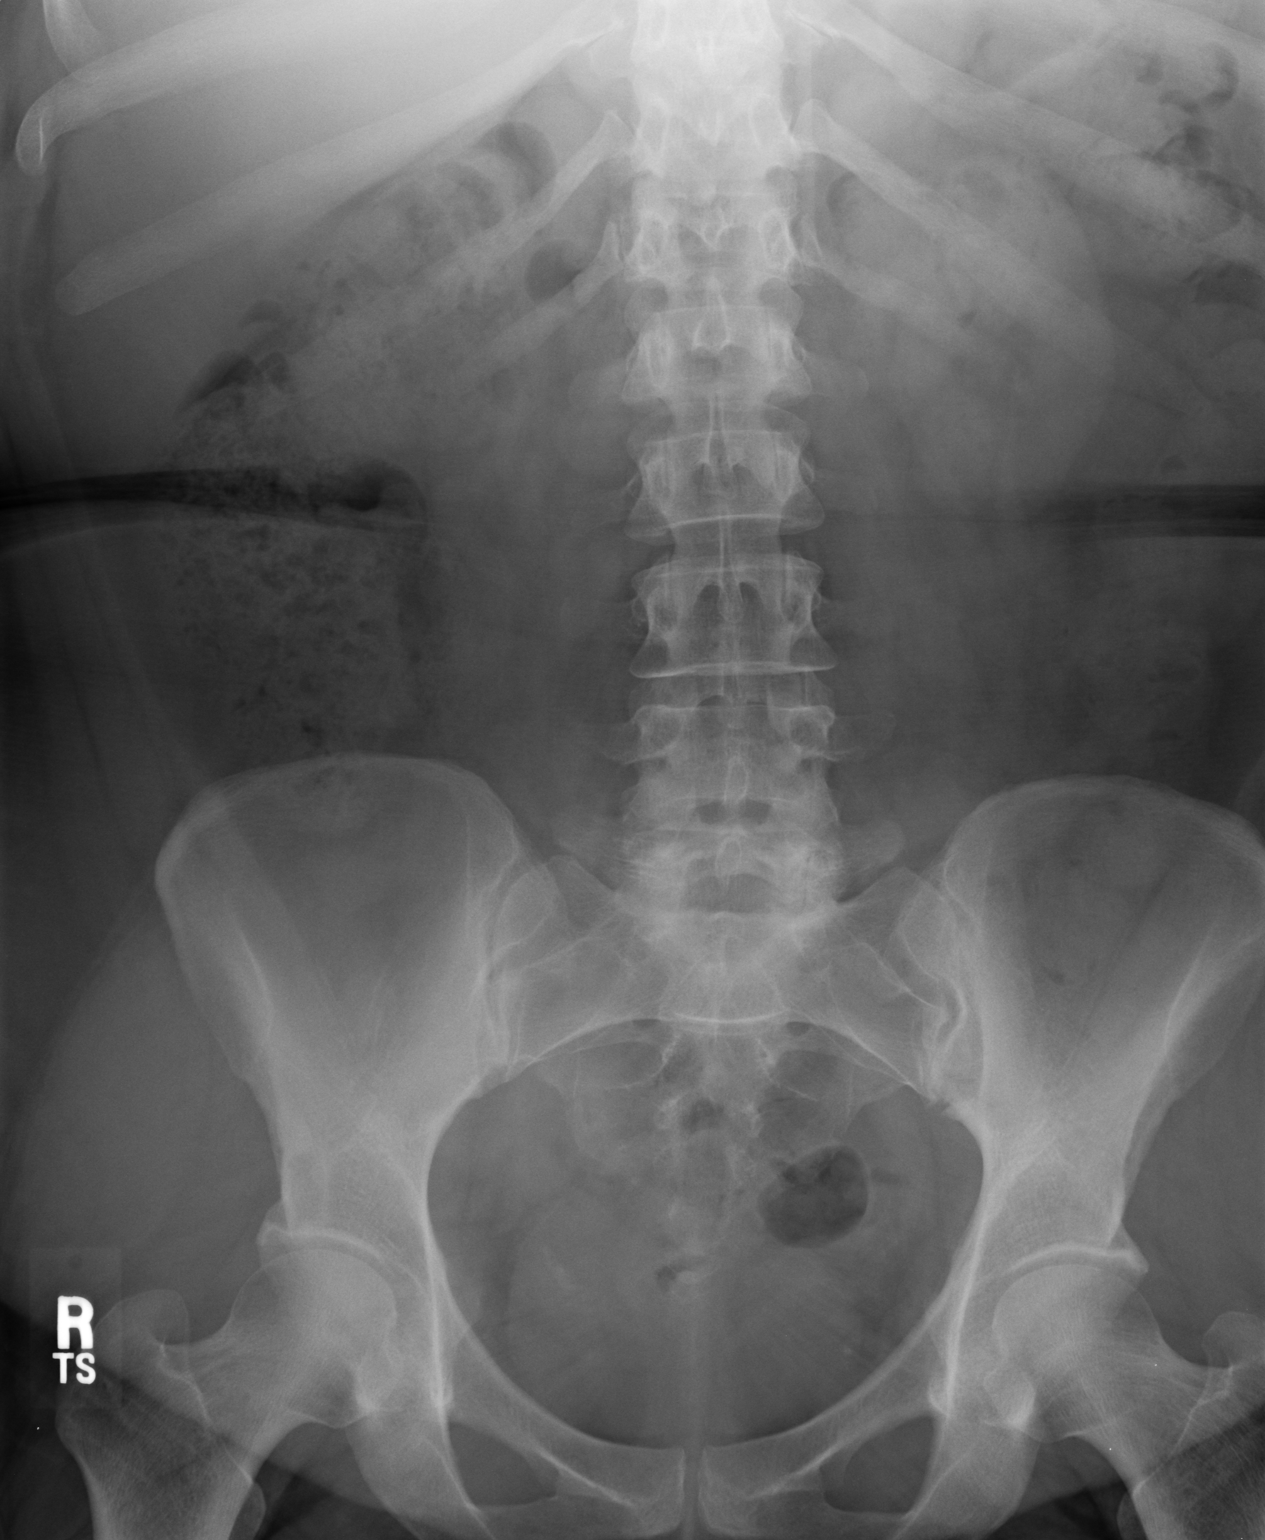

[1 of 1 positions shown; findings below may reference images not displayed]

PROCEDURE:     DXR - DXR KIDNEY URETER BLADDER  - December 05, 2007 [DATE]

RESULT:     Comparison is made to the prior exam of 11/22/2007. There are
again noted a few calcifications compatible with stone fragments projected
over the region of the distal RIGHT ureter. No definite renal calcifications
are identified at this time.
IMPRESSION: 1. No renal stones are identified.
2. There are again noted calcifications projected at the level of the distal
RIGHT ureter and compatible with residual distal RIGHT ureteral stone
fragments.

## 2007-12-19 ENCOUNTER — Ambulatory Visit: Payer: Self-pay | Admitting: Urology

## 2007-12-19 IMAGING — CR DG ABDOMEN 1V
1 series · 2 of 2 positions shown · non-contrast
Comparison: none

REASON FOR EXAM: nephrolithiasis  pt need films
COMMENTS:

[Series 1: view not recorded · 0.17mm/px · 2 of 2 slices shown]
[im 1/2]
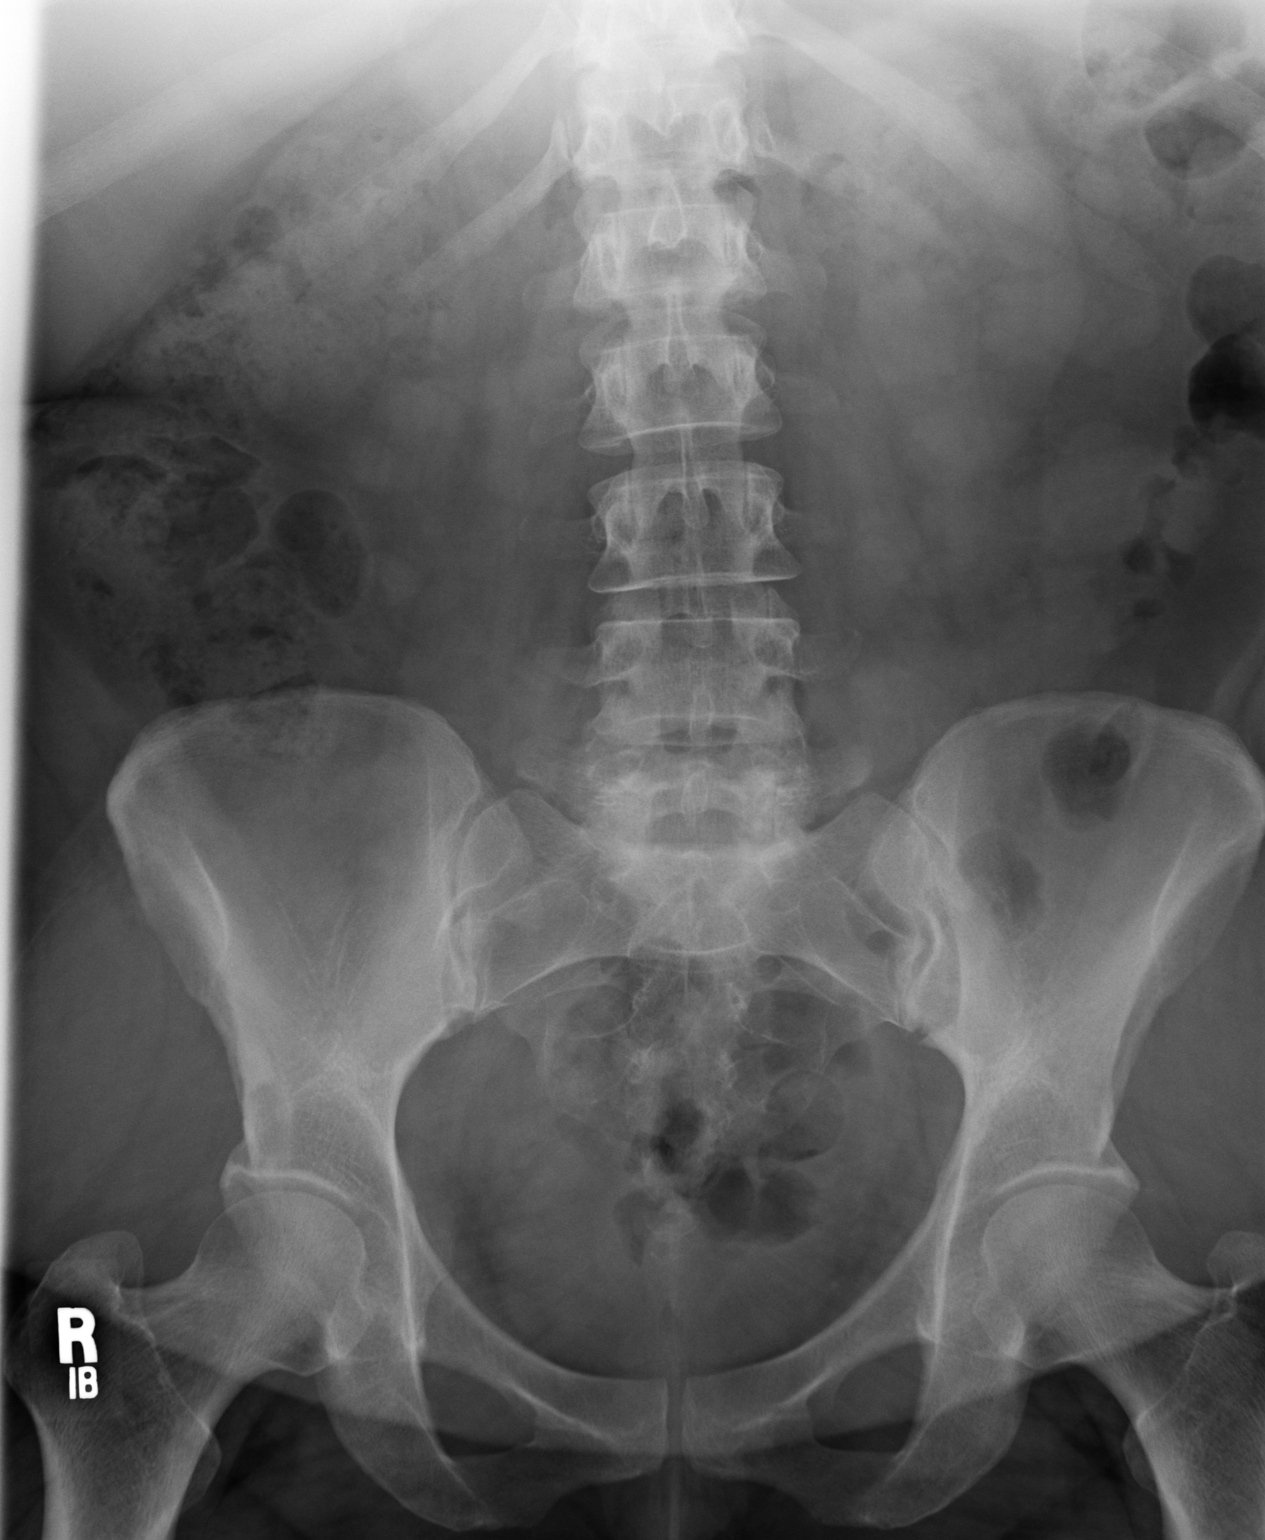
[im 2/2]
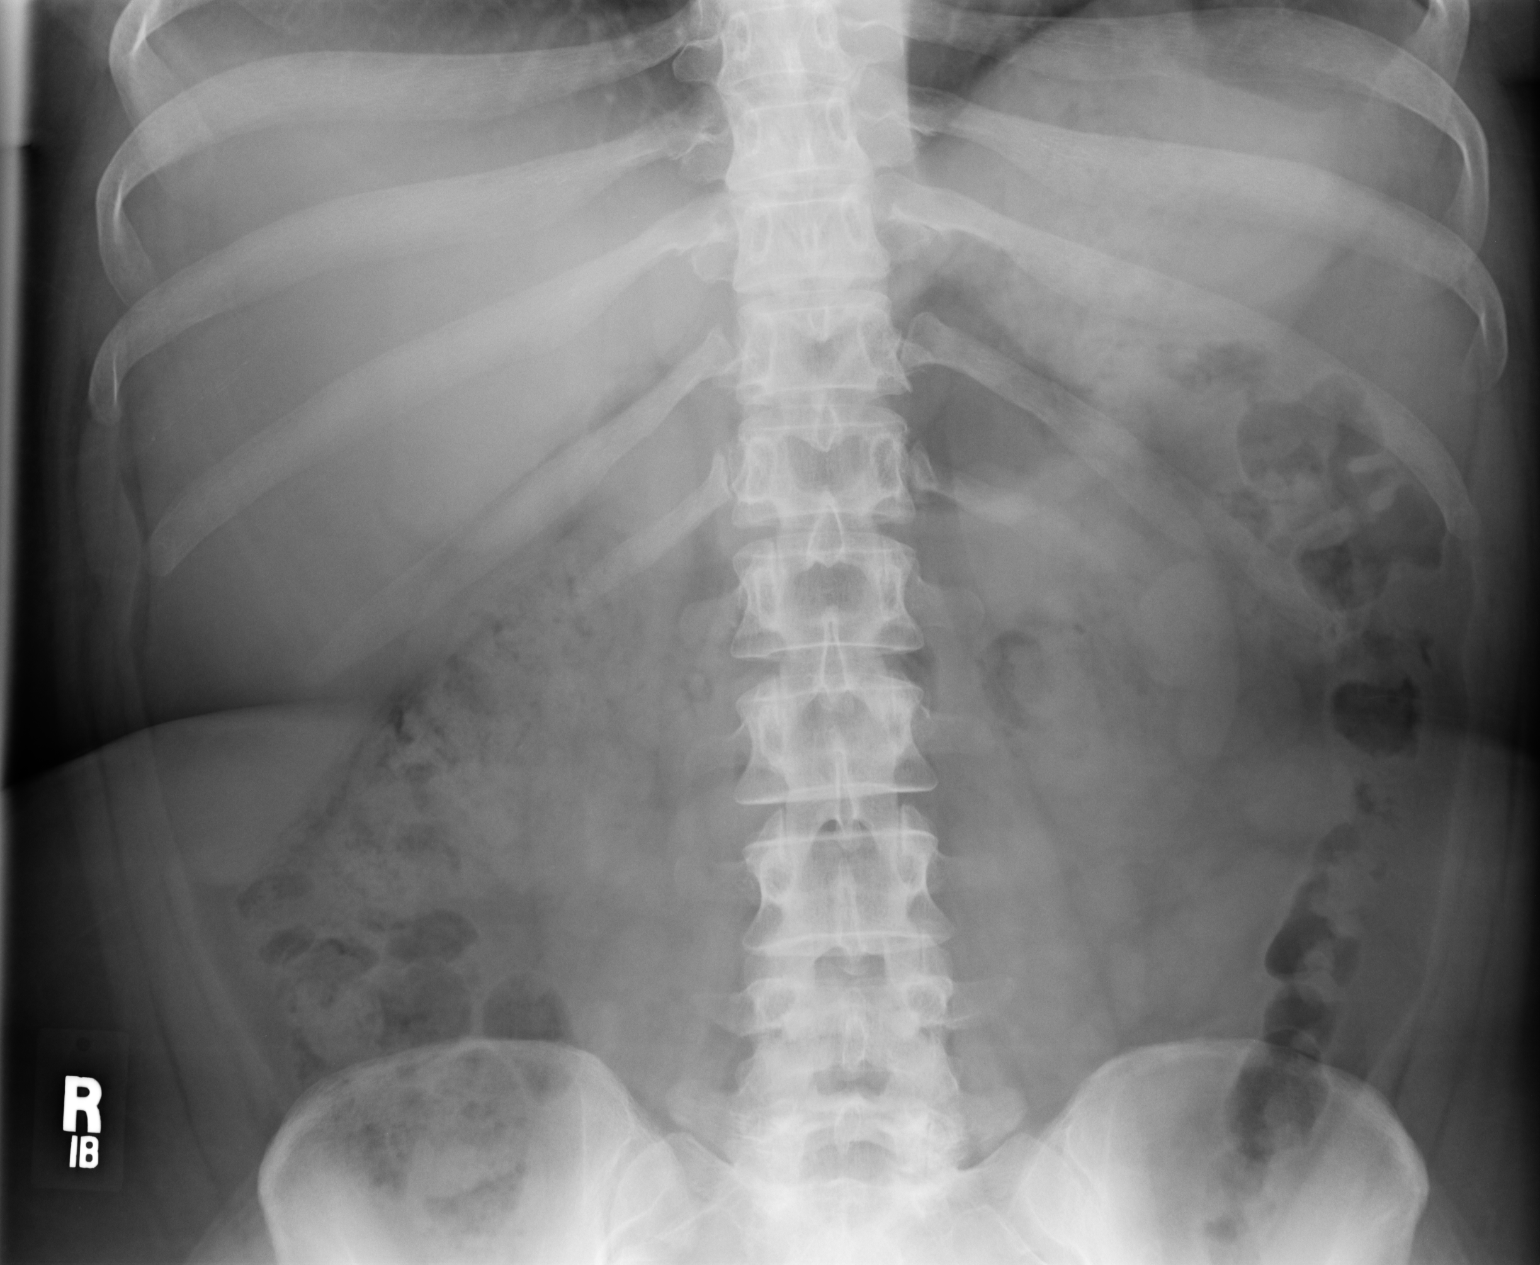

[2 of 2 positions shown; findings below may reference images not displayed]

PROCEDURE:     DXR - DXR KIDNEY URETER BLADDER  - December 19, 2007 [DATE]

RESULT:     The previously noted calcifications in the region of the distal
RIGHT ureter are no longer seen. On the current exam no definite renal or
ureteral calcifications are identified. Note is made that bowel and bowel
content partially obscure the RIGHT kidney and faintly calcified stones
could be present and not seen.
IMPRESSION: 1.     Please see above.

## 2007-12-30 ENCOUNTER — Ambulatory Visit: Payer: Self-pay | Admitting: Unknown Physician Specialty

## 2008-01-15 ENCOUNTER — Ambulatory Visit: Payer: Self-pay | Admitting: Surgery

## 2008-01-22 ENCOUNTER — Ambulatory Visit: Payer: Self-pay | Admitting: Surgery

## 2008-12-19 ENCOUNTER — Inpatient Hospital Stay: Payer: Self-pay | Admitting: Internal Medicine

## 2008-12-19 IMAGING — CR DG CHEST 1V PORT
1 series · 1 of 1 positions shown · non-contrast
Comparison: none

REASON FOR EXAM: chest pain
COMMENTS:

PROCEDURE:     DXR - DXR PORTABLE CHEST SINGLE VIEW  - December 19, 2008  [DATE]
RESULT:     Comparison: 02/27/2007

[view not recorded]
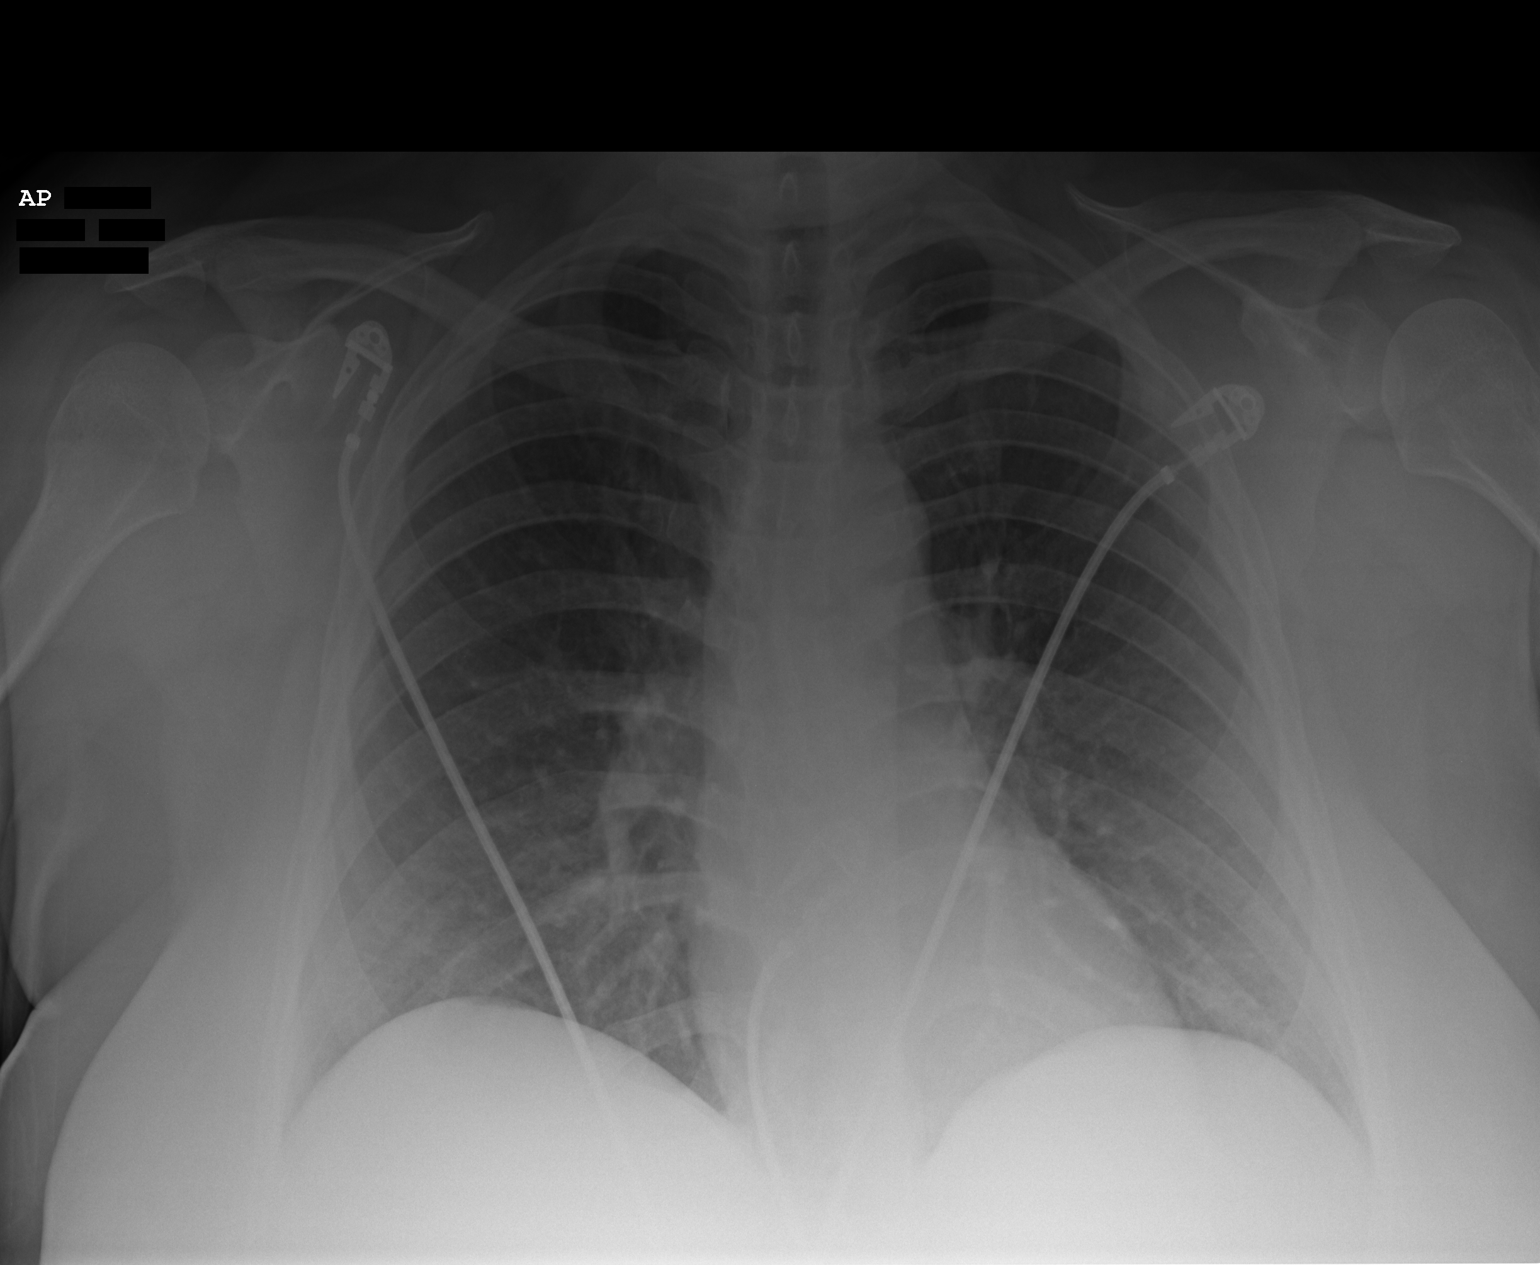

[1 of 1 positions shown; findings below may reference images not displayed]

FINDINGS: Single portable AP chest radiograph is provided. There is no focal
parenchymal opacity, pleural effusion, or pneumothorax. Normal
cardiomediastinal silhouette. The osseous structures are unremarkable.
IMPRESSION: No acute disease of the chest.

## 2009-03-30 ENCOUNTER — Emergency Department: Payer: Self-pay | Admitting: Emergency Medicine

## 2010-01-07 ENCOUNTER — Ambulatory Visit: Payer: Self-pay | Admitting: Family Medicine

## 2010-03-04 ENCOUNTER — Ambulatory Visit: Payer: Self-pay | Admitting: Family Medicine

## 2010-03-04 IMAGING — US TRANSABDOMINAL ULTRASOUND OF PELVIS
1 series · 14 of 25 positions shown · non-contrast
Comparison: none

REASON FOR EXAM: Rt Adnexal Tenderness
COMMENTS:

[Series 1: transabdominal ultrasound of pelvis · 0.41mm/px · 14 of 36 slices shown]
[im 1/36]
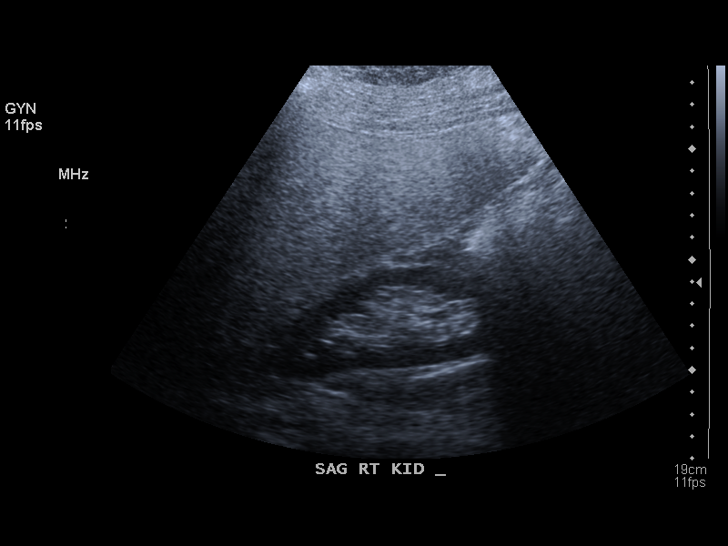
[im 3/36]
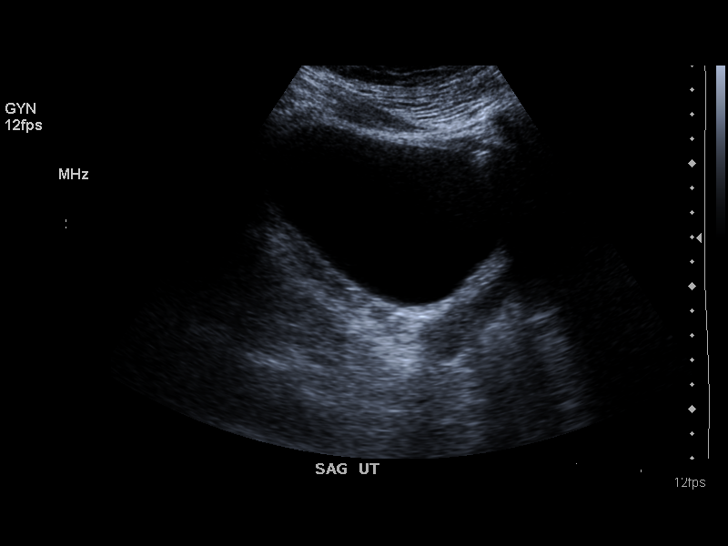
[im 6/36]
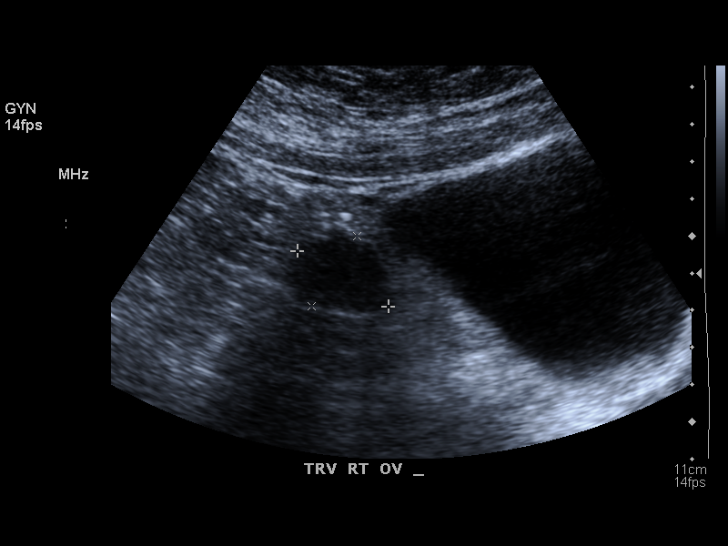
[im 9/36]
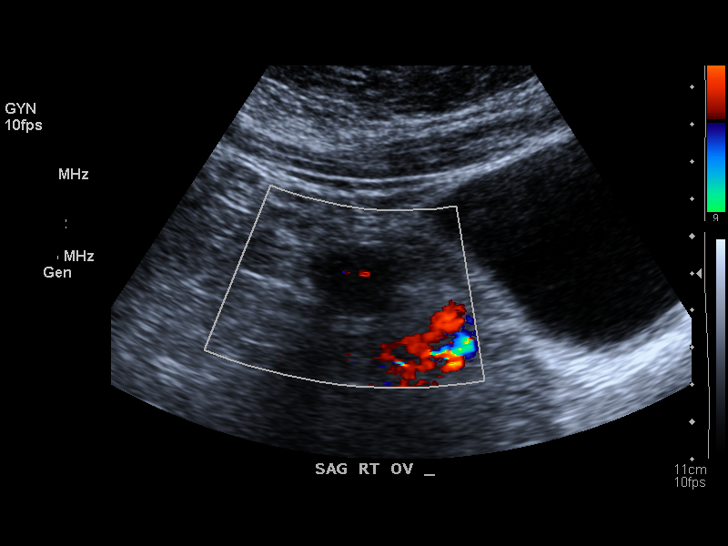
[im 12/36]
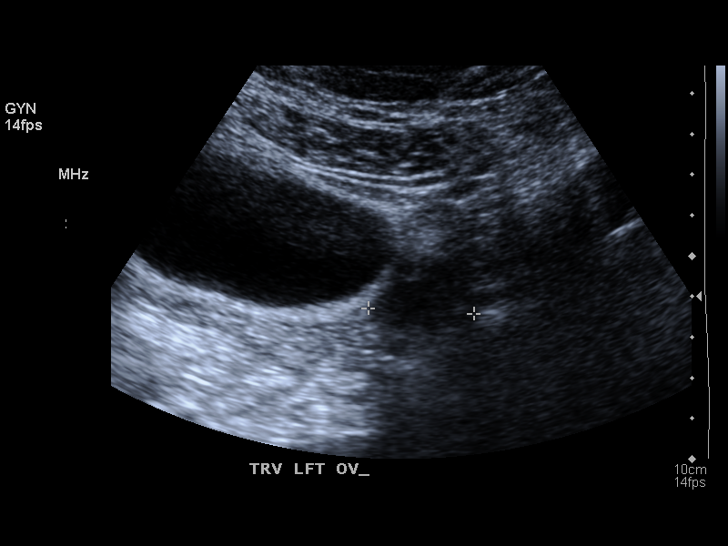
[im 14/36]
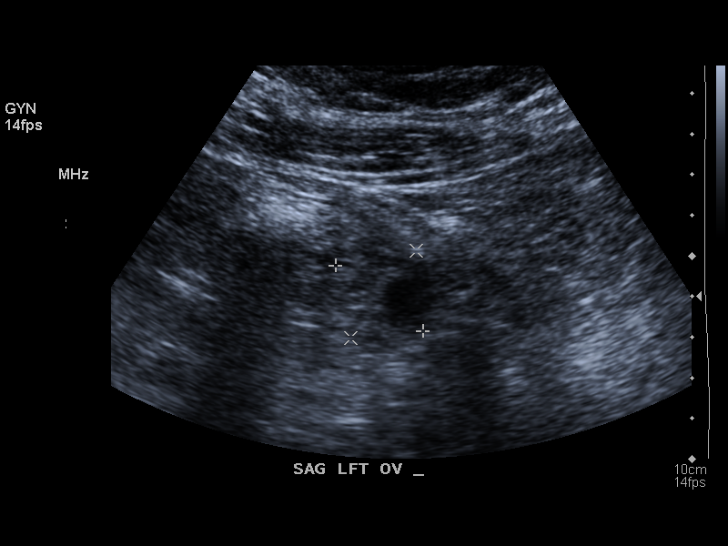
[im 17/36]
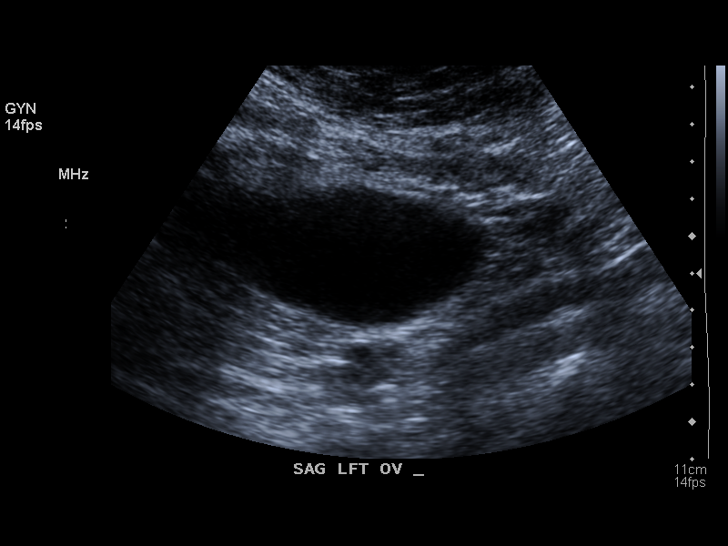
[im 19/36]
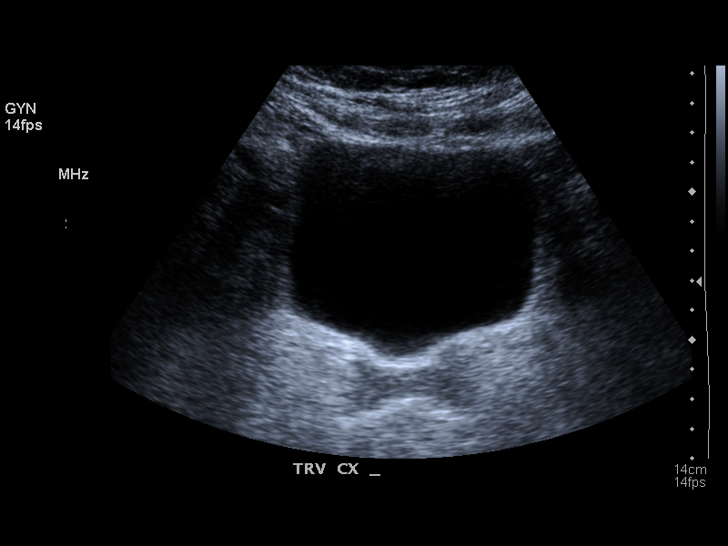
[im 22/36]
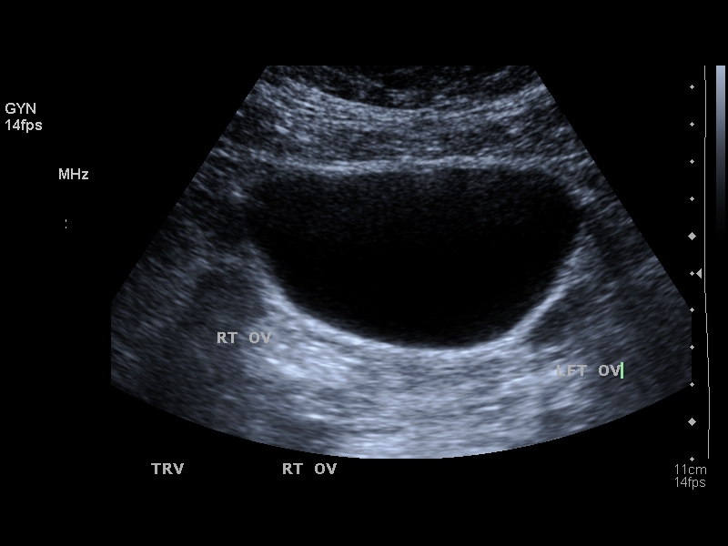
[im 24/36]
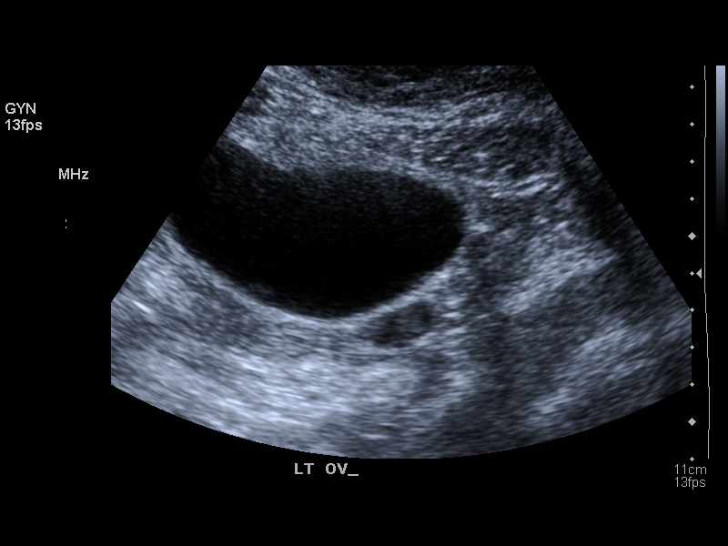
[im 27/36]
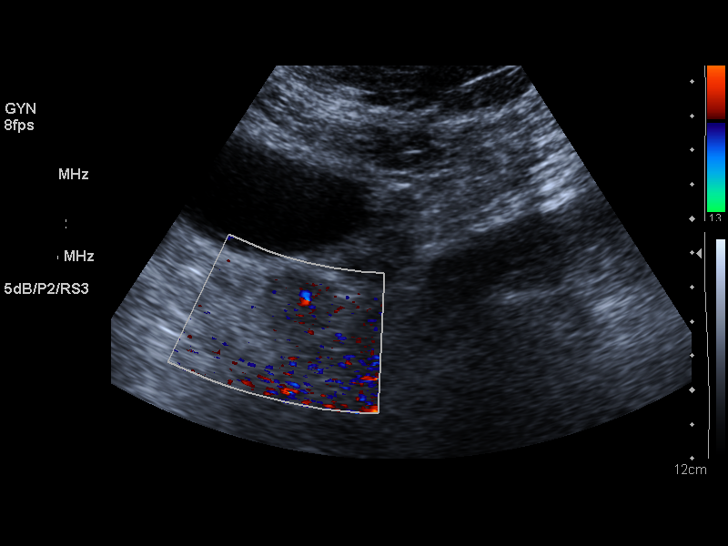
[im 30/36]
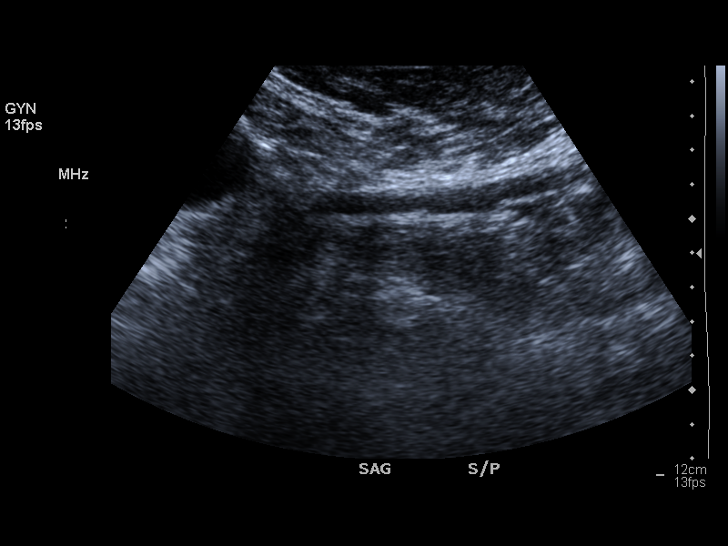
[im 33/36]
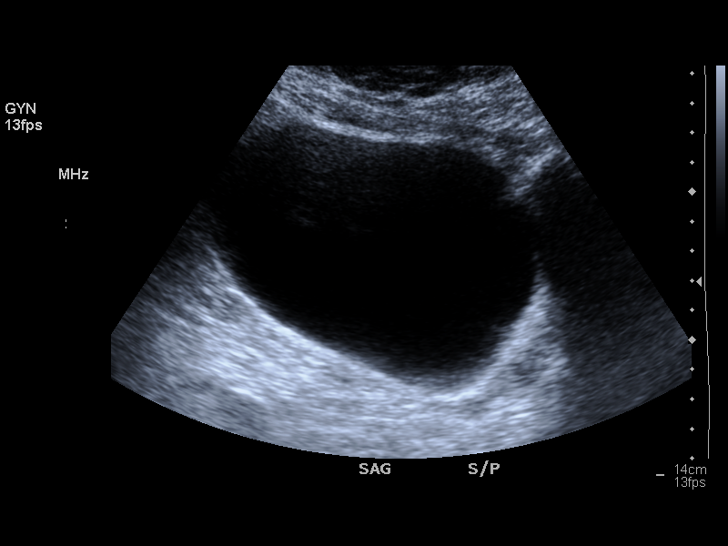
[im 36/36]
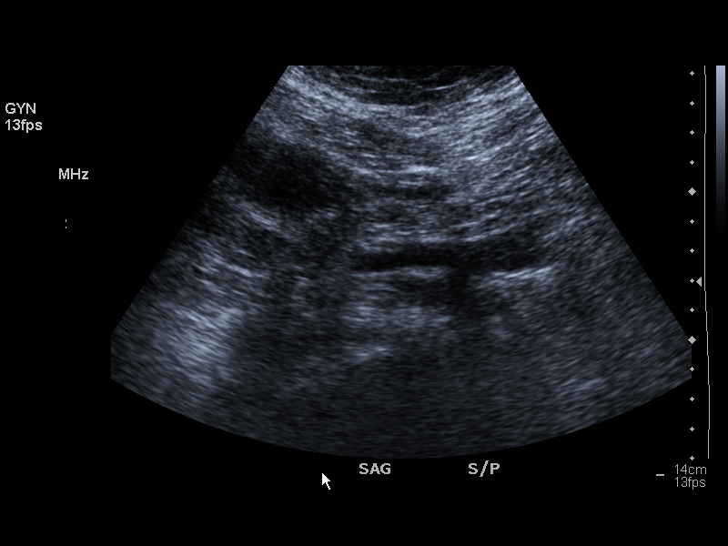

[14 of 25 positions shown; findings below may reference images not displayed]

PROCEDURE:     US  - US PELVIS EXAM  - March 04, 2010  [DATE]

RESULT:     Transabdominal pelvic ultrasound was performed. The patient is
status post hysterectomy. The right and left ovaries are visualized. The
right ovary measures 2.87 cm at maximum diameter and the left ovary measures
2.69 cm at maximum diameter. Vascular flow is seen in each ovary. No
abnormal adnexal masses are noted. No free fluid is observed in the pelvis.
The visualized portion of the urinary bladder is normal in appearance. The
kidneys show no hydronephrosis.
IMPRESSION: 1. No significant abnormalities are noted.
2. The patient is status post hysterectomy.

## 2010-04-12 ENCOUNTER — Emergency Department: Payer: Self-pay | Admitting: Emergency Medicine

## 2010-05-25 ENCOUNTER — Ambulatory Visit: Payer: Self-pay | Admitting: Family Medicine

## 2011-09-01 HISTORY — PX: GASTRIC BYPASS: SHX52

## 2012-02-21 ENCOUNTER — Ambulatory Visit: Payer: Self-pay | Admitting: Family Medicine

## 2012-02-21 IMAGING — US ULTRASOUND LEFT BREAST
1 series · 13 of 22 positions shown · non-contrast
Comparison: none

REASON FOR EXAM: lump [DATE] lt
COMMENTS:

PROCEDURE:     US  - US BREAST LEFT  - February 21, 2012  [DATE]
RESULT:     COMPARISON:  01/07/2010
TECHNIQUE: Digital diagnostic mammograms were obtained. FDA approved
computer-aided detection (CAD) for mammography was utilized for this study.
Real-time sonography of the left breast was performed at the [DATE] position
which is the site of clinical palpable concern.

[Series 1: ultrasound left breast · 0.08mm/px · 13 of 22 slices shown]
[im 1/22]
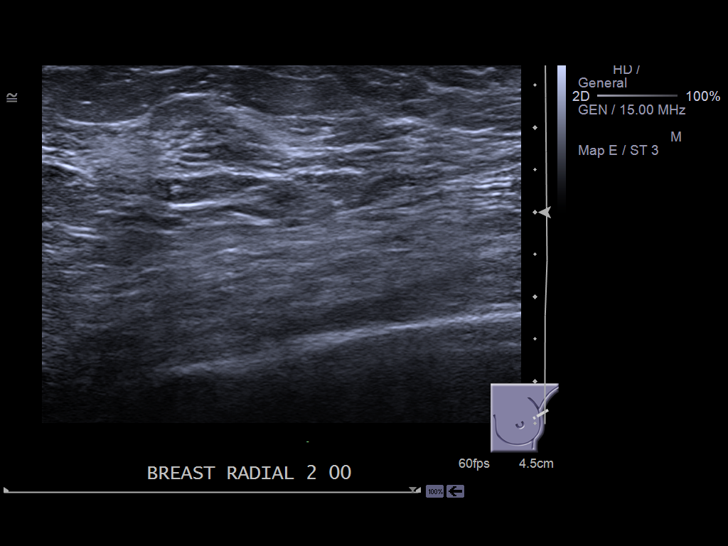
[im 3/22]
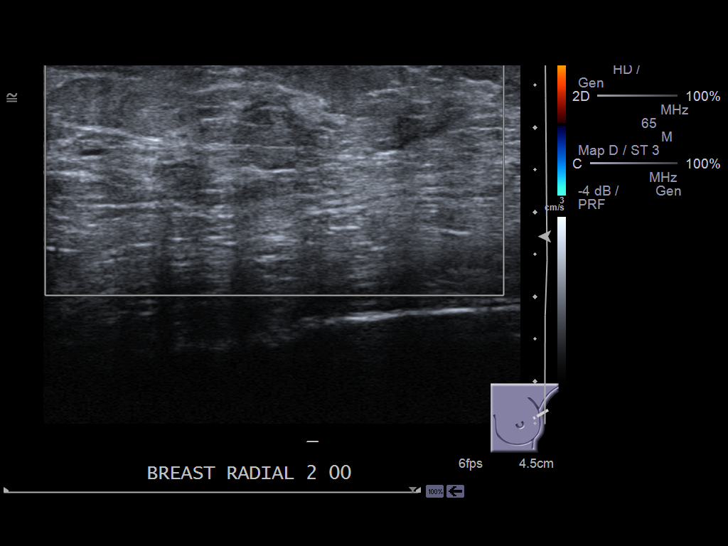
[im 5/22]
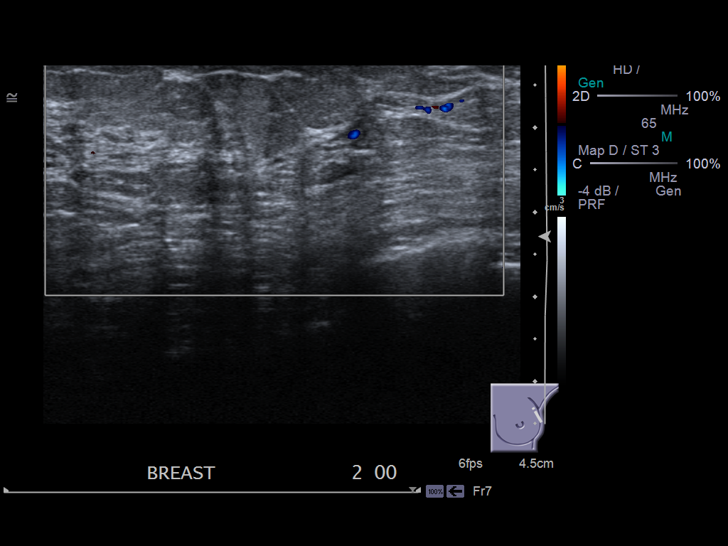
[im 6/22]
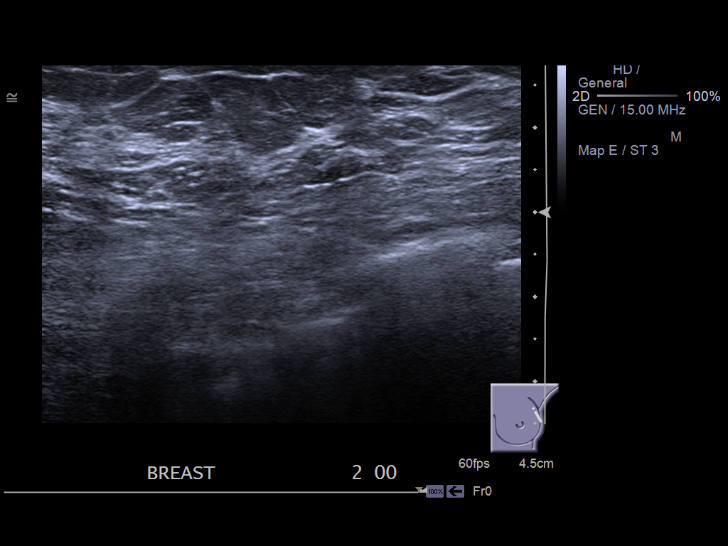
[im 8/22]
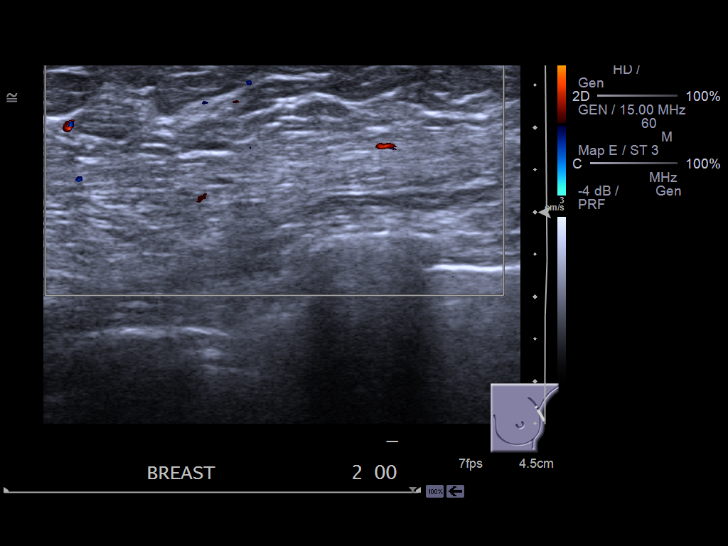
[im 10/22]
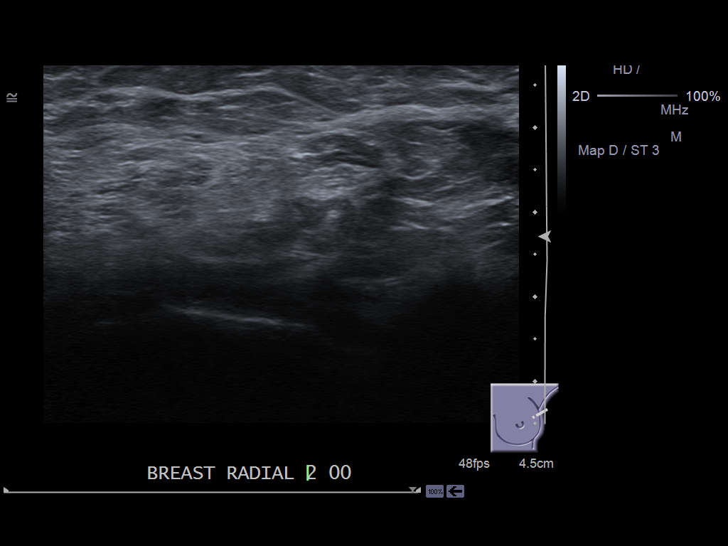
[im 12/22]
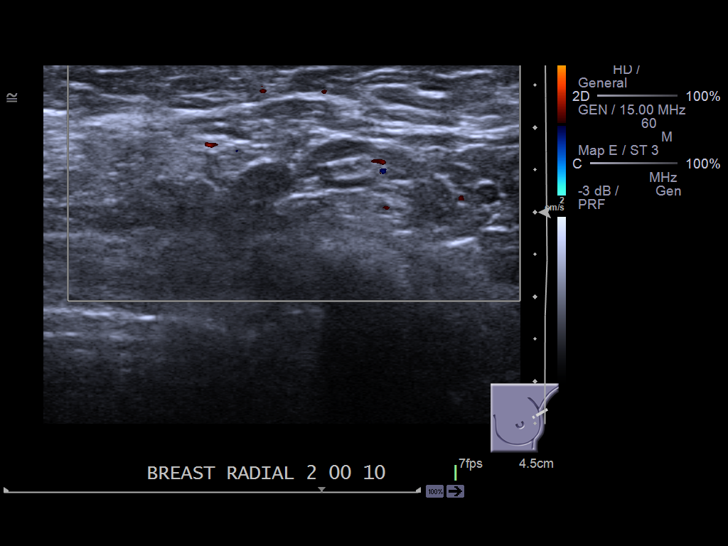
[im 13/22]
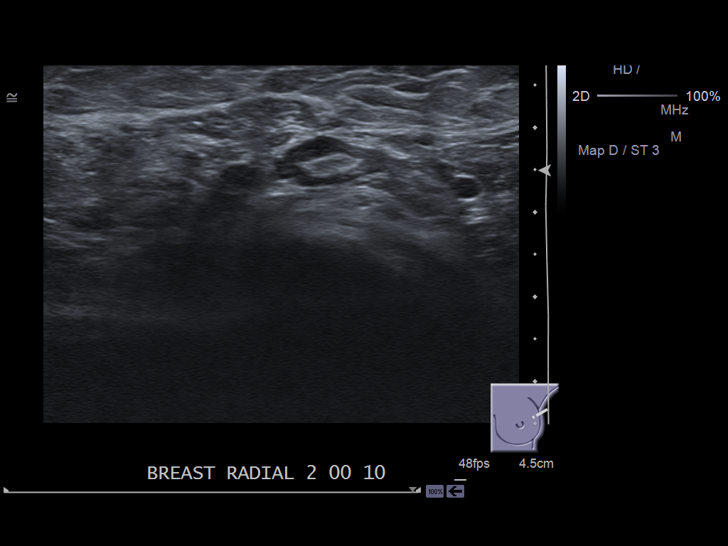
[im 15/22]
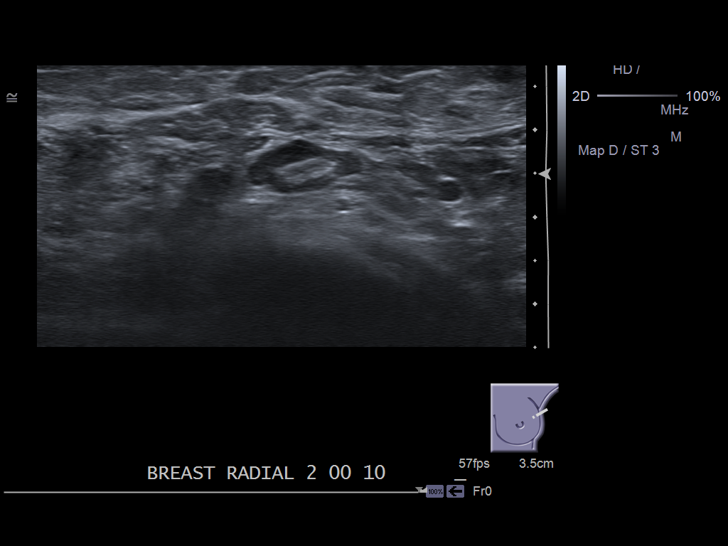
[im 17/22]
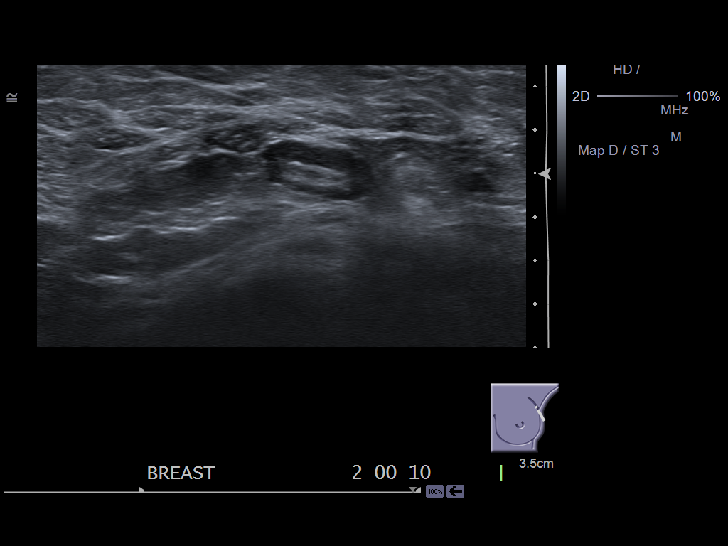
[im 18/22]
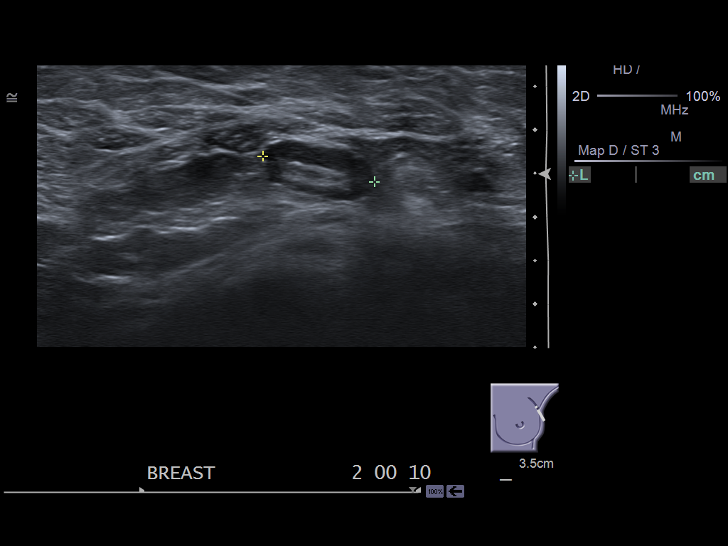
[im 20/22]
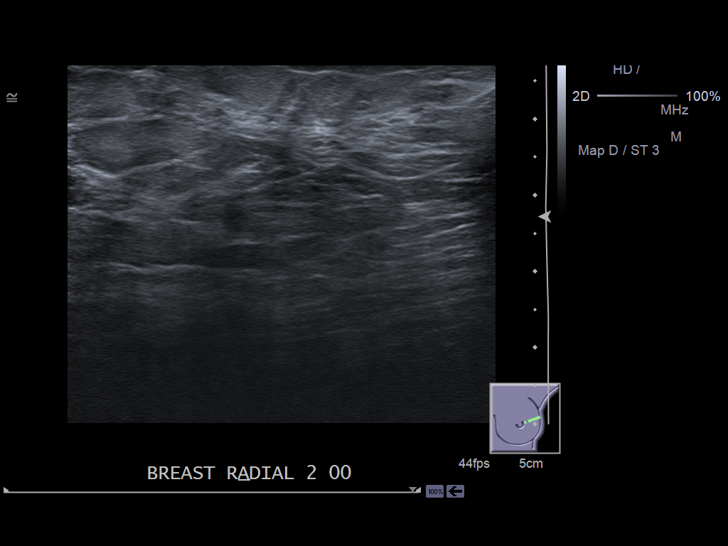
[im 22/22]
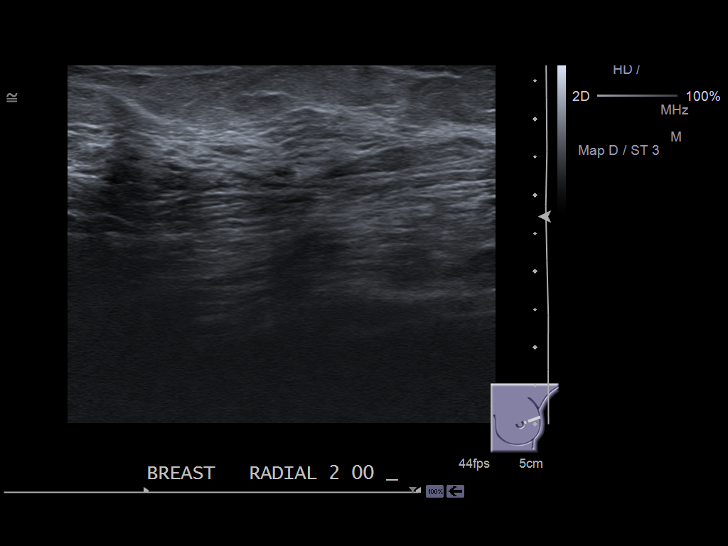

[13 of 22 positions shown; findings below may reference images not displayed]

FINDING: Bilateral breasts demonstrate a scattered fibroglandular density. There is
no dominant mass, architectural distortion or clusters of suspicious
microcalcifications.

Real-time sonography of the left breast at the [DATE] position was performed.
There is a 1.3 x 0.6 x 1.3 cm hypoechoic mass with a hyperechoic hilum
consistent with a lymph node. There is no other solid or cystic mass.
IMPRESSION: 1.   Benign appearing lymph node at the [DATE] position in the left breast.
Otherwise no sonographic or mammographic abnormality.
2.     Annual mammographic follow up recommended.
3.     BI-RADS:  Category 2- Benign.

A negative mammogram report does not preclude biopsy or other evaluation of
a clinically palpable or otherwise suspicious mass or lesion. Breast cancer
may not be detected by mammography in up to 10% of cases.

[REDACTED]

## 2012-08-22 ENCOUNTER — Emergency Department: Payer: Self-pay | Admitting: Internal Medicine

## 2012-08-22 IMAGING — CR DG FOOT COMPLETE 3+V*L*
1 series · 3 of 3 positions shown · non-contrast
Comparison: none

REASON FOR EXAM: injury
COMMENTS:

[Series 1: ap · 0.17mm/px · 3 of 3 slices shown]
[im 1/3]
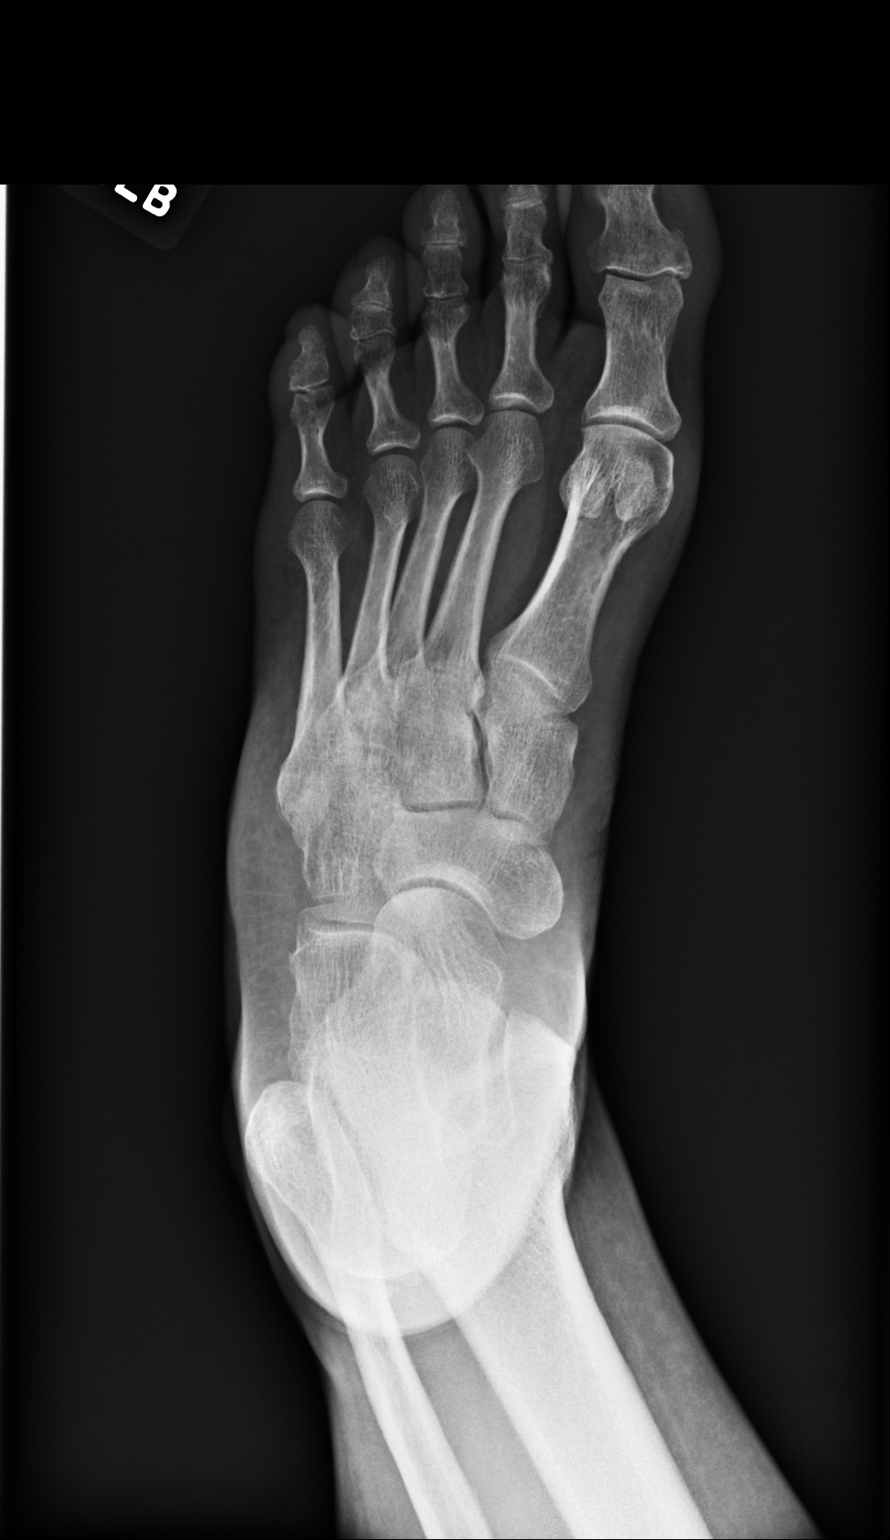
[im 2/3]
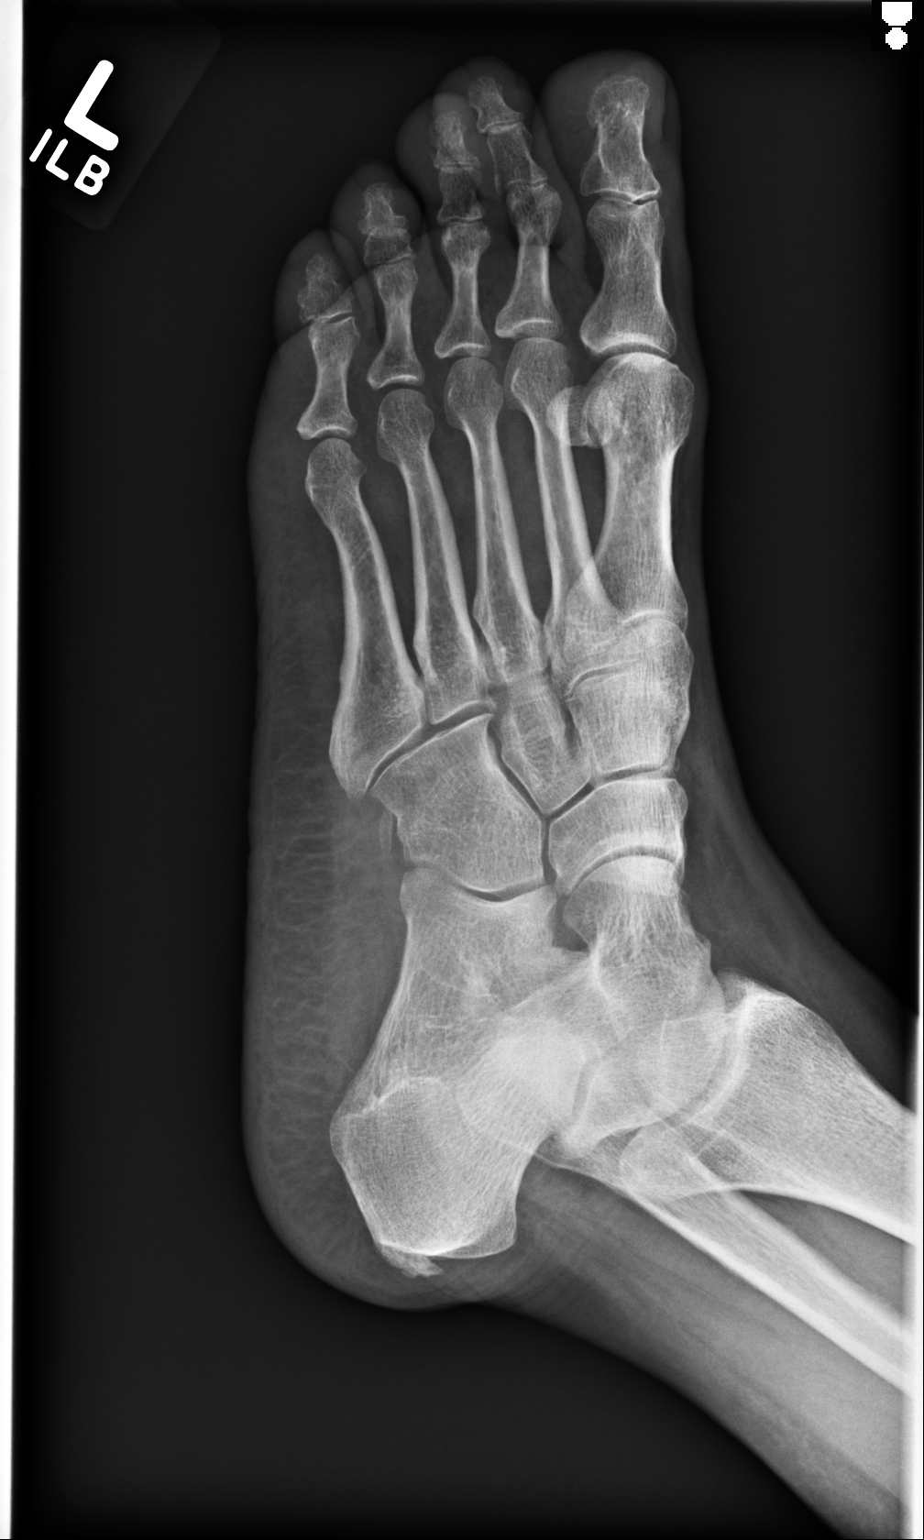
[im 3/3]
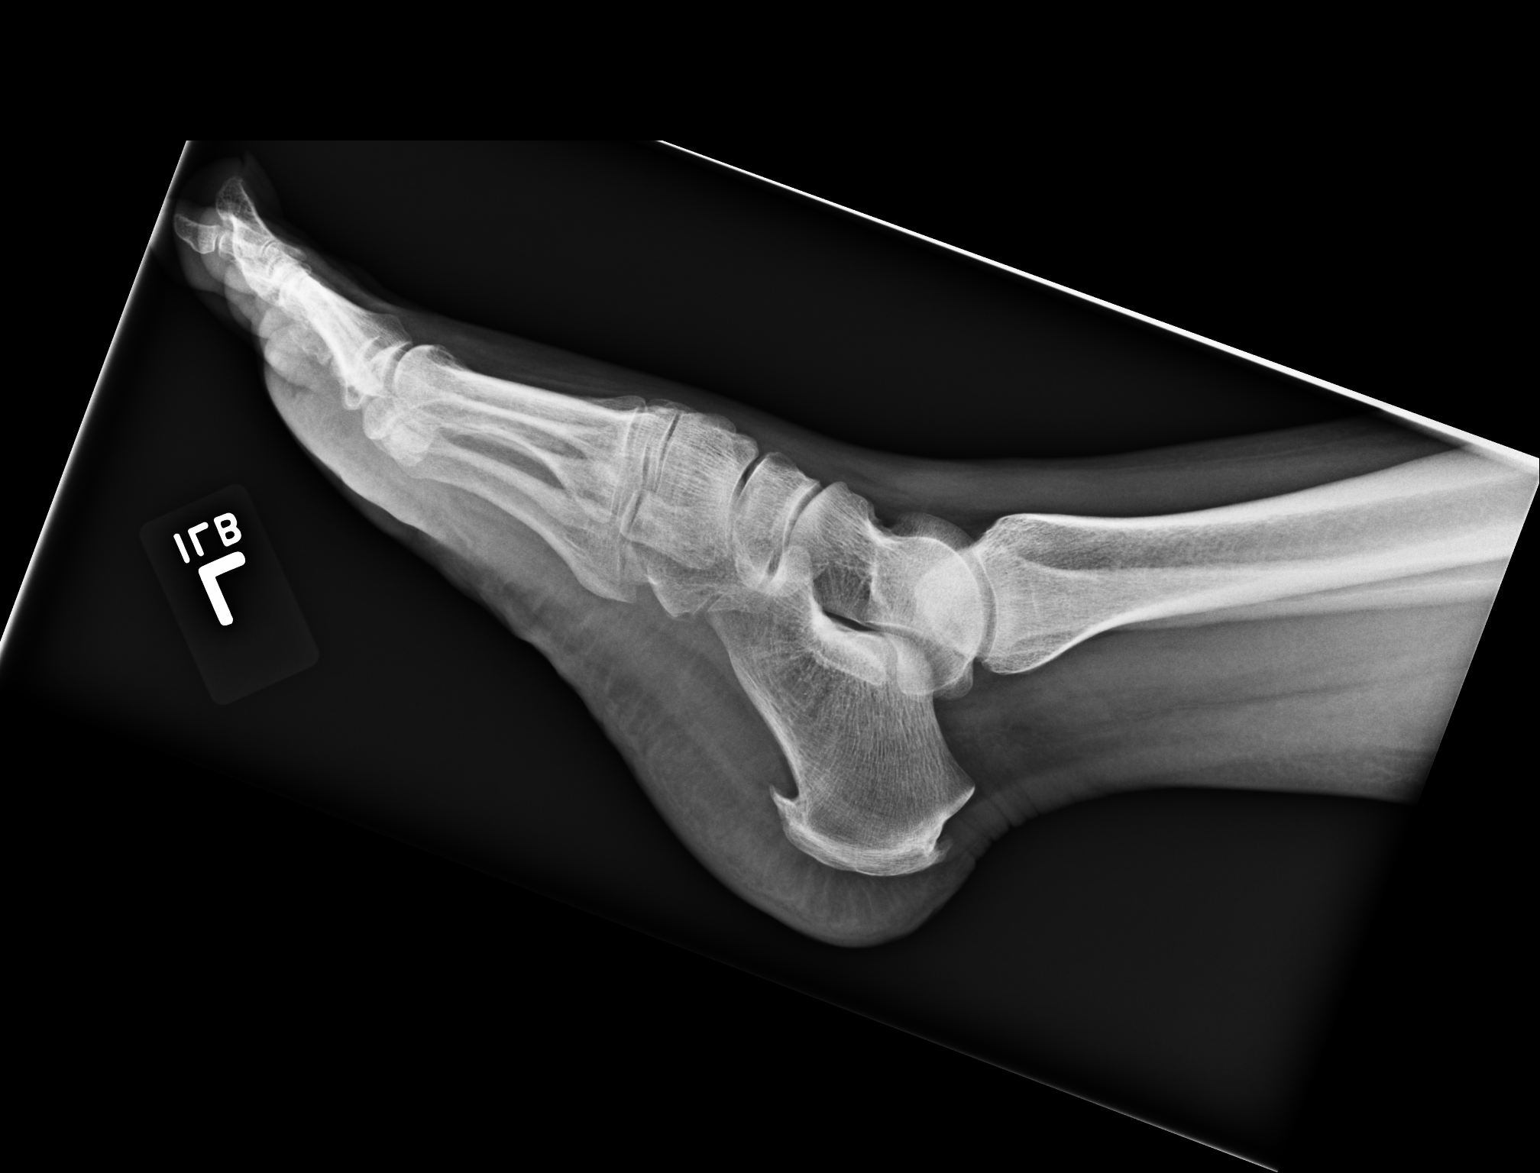

[3 of 3 positions shown; findings below may reference images not displayed]

PROCEDURE:     DXR - DXR FOOT LT COMP W/OBLIQUES  - August 22, 2012  [DATE]

RESULT:     Three views of the left foot are submitted. The bones appear
reasonably well mineralized. There is no evidence of an acute phalangeal nor
metatarsal fracture. The tarsal bones are intact. There are large plantar
and Achilles region calcaneal spurs.
IMPRESSION: There are spurs associated with the calcaneus. There is no
evidence of an acute fracture. Mild degenerative changes are noted which are
age-appropriate. Followup imaging coned to any area of persistent symptoms
is available upon reque[REDACTED]

## 2012-12-07 ENCOUNTER — Emergency Department: Payer: Self-pay | Admitting: Unknown Physician Specialty

## 2013-04-09 ENCOUNTER — Ambulatory Visit: Payer: Self-pay | Admitting: Family Medicine

## 2014-02-21 ENCOUNTER — Emergency Department: Payer: Self-pay | Admitting: Internal Medicine

## 2014-02-21 LAB — COMPREHENSIVE METABOLIC PANEL
Albumin: 3.4 g/dL (ref 3.4–5.0)
Alkaline Phosphatase: 66 U/L
Anion Gap: 7 (ref 7–16)
BILIRUBIN TOTAL: 0.4 mg/dL (ref 0.2–1.0)
BUN: 15 mg/dL (ref 7–18)
CO2: 26 mmol/L (ref 21–32)
Calcium, Total: 8.4 mg/dL — ABNORMAL LOW (ref 8.5–10.1)
Chloride: 111 mmol/L — ABNORMAL HIGH (ref 98–107)
Creatinine: 0.83 mg/dL (ref 0.60–1.30)
EGFR (Non-African Amer.): >60
Glucose: 67 mg/dL (ref 65–99)
OSMOLALITY: 286 (ref 275–301)
POTASSIUM: 3.5 mmol/L (ref 3.5–5.1)
SGOT(AST): 25 U/L (ref 15–37)
SGPT (ALT): 18 U/L
SODIUM: 144 mmol/L (ref 136–145)
TOTAL PROTEIN: 6.8 g/dL (ref 6.4–8.2)

## 2014-02-21 LAB — CK TOTAL AND CKMB (NOT AT ARMC)
CK, TOTAL: 65 U/L
CK-MB: 0.6 ng/mL (ref 0.5–3.6)

## 2014-02-21 LAB — TROPONIN I
Troponin-I: <0.02 ng/mL
Troponin-I: <0.02 ng/mL

## 2014-02-21 LAB — CBC
HCT: 38.9 % (ref 35.0–47.0)
HGB: 12.6 g/dL (ref 12.0–16.0)
MCH: 29.9 pg (ref 26.0–34.0)
MCHC: 32.5 g/dL (ref 32.0–36.0)
MCV: 92 fL (ref 80–100)
Platelet: 173 10*3/uL (ref 150–440)
RBC: 4.22 10*6/uL (ref 3.80–5.20)
RDW: 13.1 % (ref 11.5–14.5)
WBC: 8.6 10*3/uL (ref 3.6–11.0)

## 2014-02-21 IMAGING — CR DG CHEST 1V PORT
1 series · 1 of 1 positions shown · non-contrast
Comparison: None.

CLINICAL DATA: Chest pain

EXAM:
PORTABLE CHEST - 1 VIEW

[ap]
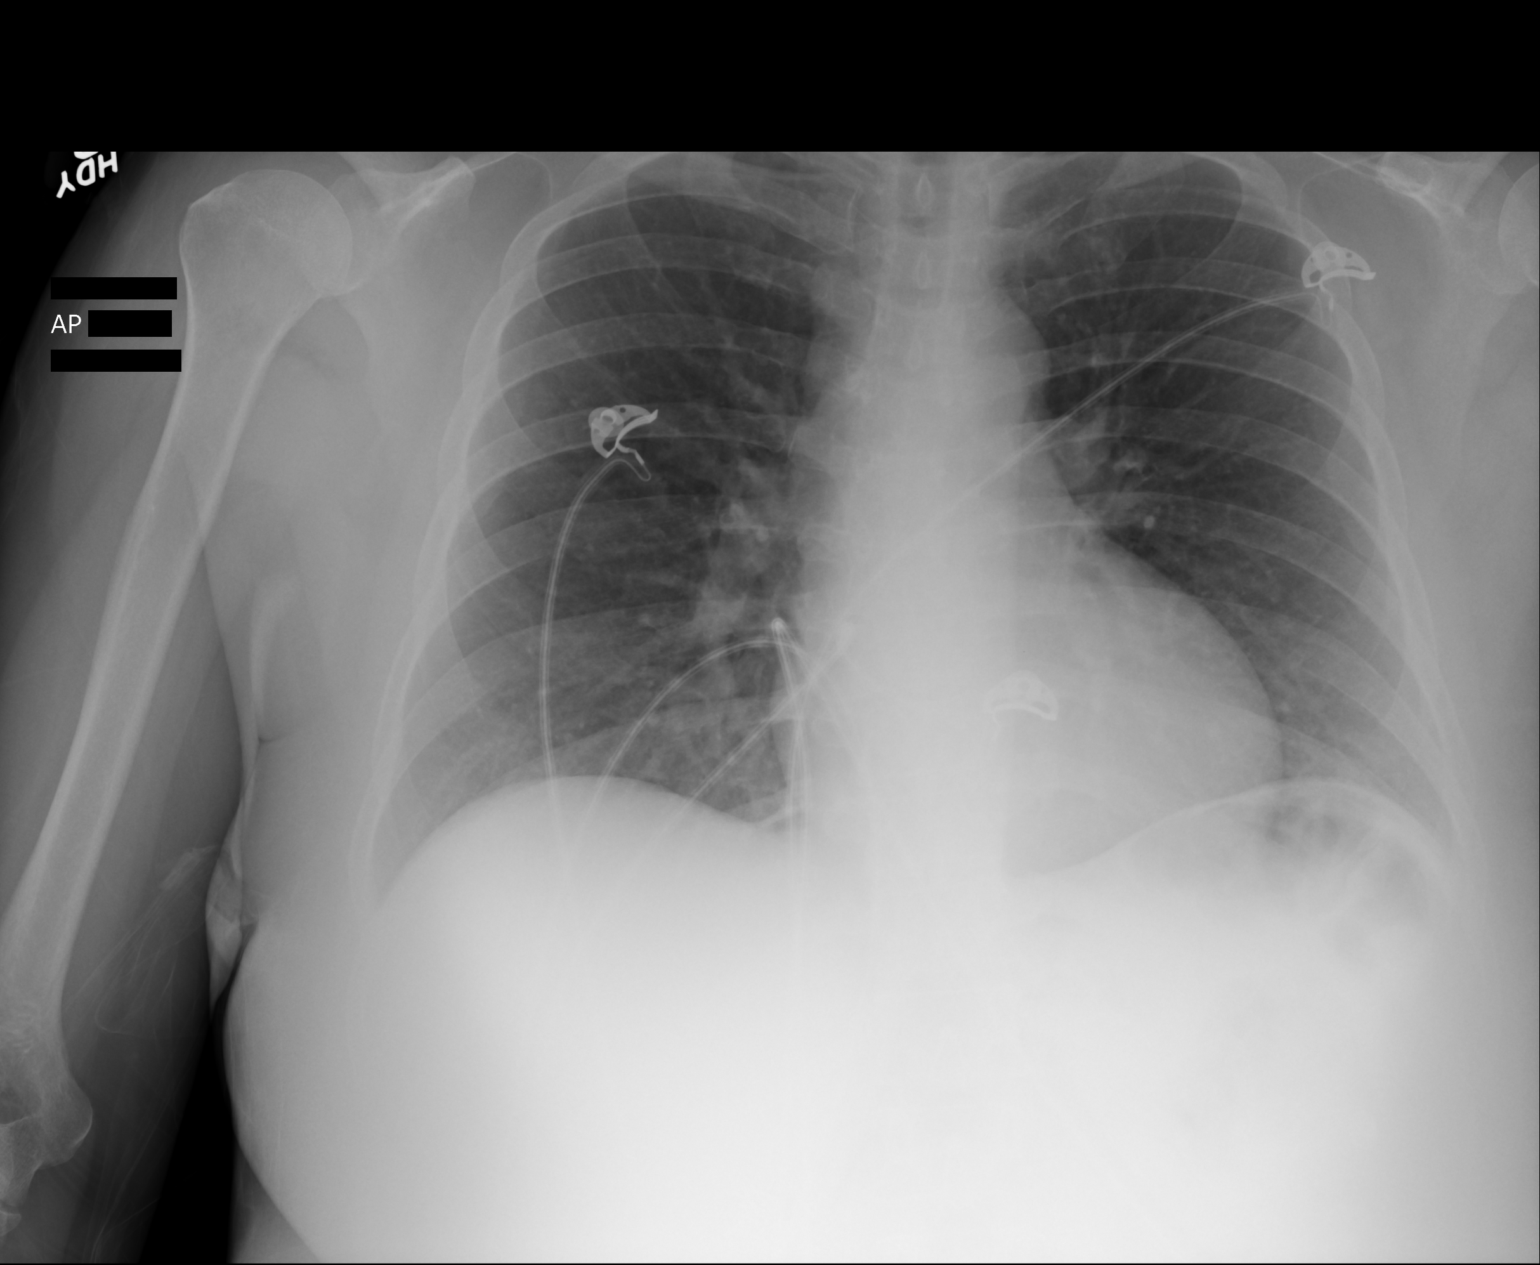

[1 of 1 positions shown; findings below may reference images not displayed]

FINDINGS: The heart size and mediastinal contours are within normal limits.
Both lungs are clear. The visualized skeletal structures are
unremarkable.
IMPRESSION: No active disease.

## 2014-04-21 ENCOUNTER — Emergency Department: Payer: Self-pay | Admitting: Emergency Medicine

## 2014-04-21 LAB — CBC WITH DIFFERENTIAL/PLATELET
BASOS PCT: 0.5 %
Basophil #: 0.1 10*3/uL (ref 0.0–0.1)
Eosinophil #: 0.1 10*3/uL (ref 0.0–0.7)
Eosinophil %: 1.3 %
HCT: 36.3 % (ref 35.0–47.0)
HGB: 11.6 g/dL — ABNORMAL LOW (ref 12.0–16.0)
Lymphocyte #: 2.4 10*3/uL (ref 1.0–3.6)
Lymphocyte %: 21.1 %
MCH: 29.2 pg (ref 26.0–34.0)
MCHC: 31.9 g/dL — AB (ref 32.0–36.0)
MCV: 92 fL (ref 80–100)
MONO ABS: 0.8 x10 3/mm (ref 0.2–0.9)
MONOS PCT: 6.9 %
NEUTROS PCT: 70.2 %
Neutrophil #: 7.9 10*3/uL — ABNORMAL HIGH (ref 1.4–6.5)
Platelet: 159 10*3/uL (ref 150–440)
RBC: 3.96 10*6/uL (ref 3.80–5.20)
RDW: 12.5 % (ref 11.5–14.5)
WBC: 11.2 10*3/uL — ABNORMAL HIGH (ref 3.6–11.0)

## 2014-04-21 LAB — BASIC METABOLIC PANEL
Anion Gap: 8 (ref 7–16)
BUN: 16 mg/dL (ref 7–18)
CALCIUM: 8.4 mg/dL — AB (ref 8.5–10.1)
Chloride: 108 mmol/L — ABNORMAL HIGH (ref 98–107)
Co2: 25 mmol/L (ref 21–32)
Creatinine: 0.7 mg/dL (ref 0.60–1.30)
EGFR (Non-African Amer.): >60
Glucose: 85 mg/dL (ref 65–99)
OSMOLALITY: 282 (ref 275–301)
POTASSIUM: 3.9 mmol/L (ref 3.5–5.1)
Sodium: 141 mmol/L (ref 136–145)

## 2014-04-21 LAB — URINALYSIS, COMPLETE
Bilirubin,UR: NEGATIVE
Blood: NEGATIVE
GLUCOSE, UR: NEGATIVE mg/dL (ref 0–75)
Ketone: NEGATIVE
Leukocyte Esterase: NEGATIVE
NITRITE: NEGATIVE
PH: 7 (ref 4.5–8.0)
Protein: NEGATIVE
RBC,UR: <1 /HPF (ref 0–5)
Specific Gravity: 1.005 (ref 1.003–1.030)

## 2014-04-29 ENCOUNTER — Emergency Department: Payer: Self-pay | Admitting: Emergency Medicine

## 2014-04-29 LAB — COMPREHENSIVE METABOLIC PANEL
ALK PHOS: 69 U/L
ANION GAP: 8 (ref 7–16)
AST: 20 U/L (ref 15–37)
Albumin: 3.3 g/dL — ABNORMAL LOW (ref 3.4–5.0)
BUN: 11 mg/dL (ref 7–18)
Bilirubin,Total: 0.5 mg/dL (ref 0.2–1.0)
CALCIUM: 8.2 mg/dL — AB (ref 8.5–10.1)
Chloride: 111 mmol/L — ABNORMAL HIGH (ref 98–107)
Co2: 23 mmol/L (ref 21–32)
Creatinine: 0.84 mg/dL (ref 0.60–1.30)
EGFR (African American): >60
EGFR (Non-African Amer.): >60
Glucose: 157 mg/dL — ABNORMAL HIGH (ref 65–99)
Osmolality: 286 (ref 275–301)
Potassium: 3.8 mmol/L (ref 3.5–5.1)
SGPT (ALT): 18 U/L
Sodium: 142 mmol/L (ref 136–145)
TOTAL PROTEIN: 7 g/dL (ref 6.4–8.2)

## 2014-04-29 LAB — CBC
HCT: 40.3 % (ref 35.0–47.0)
HGB: 13.2 g/dL (ref 12.0–16.0)
MCH: 30.2 pg (ref 26.0–34.0)
MCHC: 32.8 g/dL (ref 32.0–36.0)
MCV: 92 fL (ref 80–100)
Platelet: 186 10*3/uL (ref 150–440)
RBC: 4.37 10*6/uL (ref 3.80–5.20)
RDW: 12.8 % (ref 11.5–14.5)
WBC: 10.7 10*3/uL (ref 3.6–11.0)

## 2014-04-29 LAB — TROPONIN I: Troponin-I: <0.02 ng/mL

## 2014-09-24 ENCOUNTER — Emergency Department: Payer: Self-pay | Admitting: Emergency Medicine

## 2014-09-24 IMAGING — CR DG CHEST 2V
1 series · 2 of 2 positions shown · non-contrast
Comparison: Portable chest x-ray February 21, 2014

CLINICAL DATA: Four days of cough, 2 days of fever.  Nonsmoker.

EXAM:
CHEST  2 VIEW

[Series 1: dxr chest pa (or ap) and lateral · 0.14mm/px · 2 of 2 slices shown]
[im 1/2]
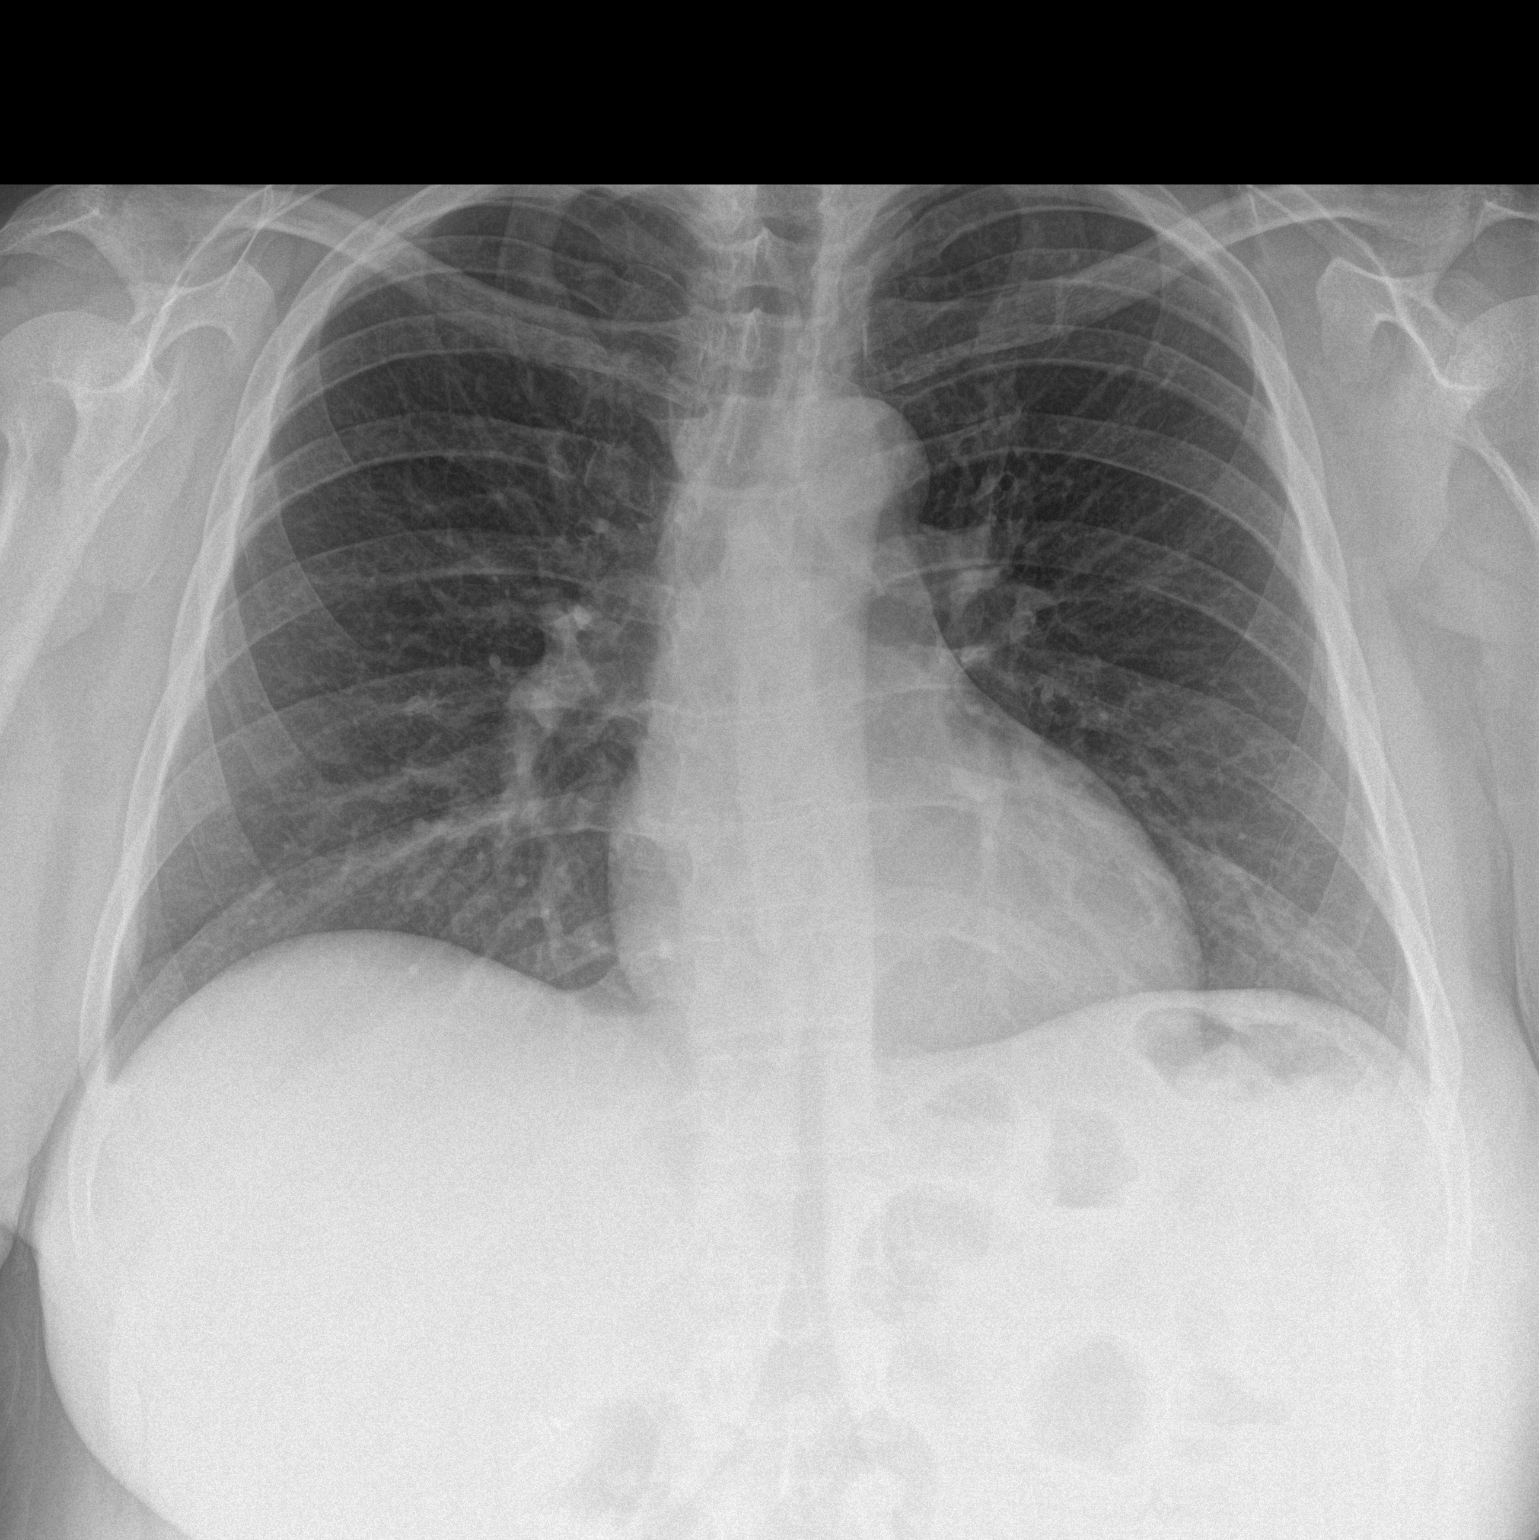
[im 2/2]
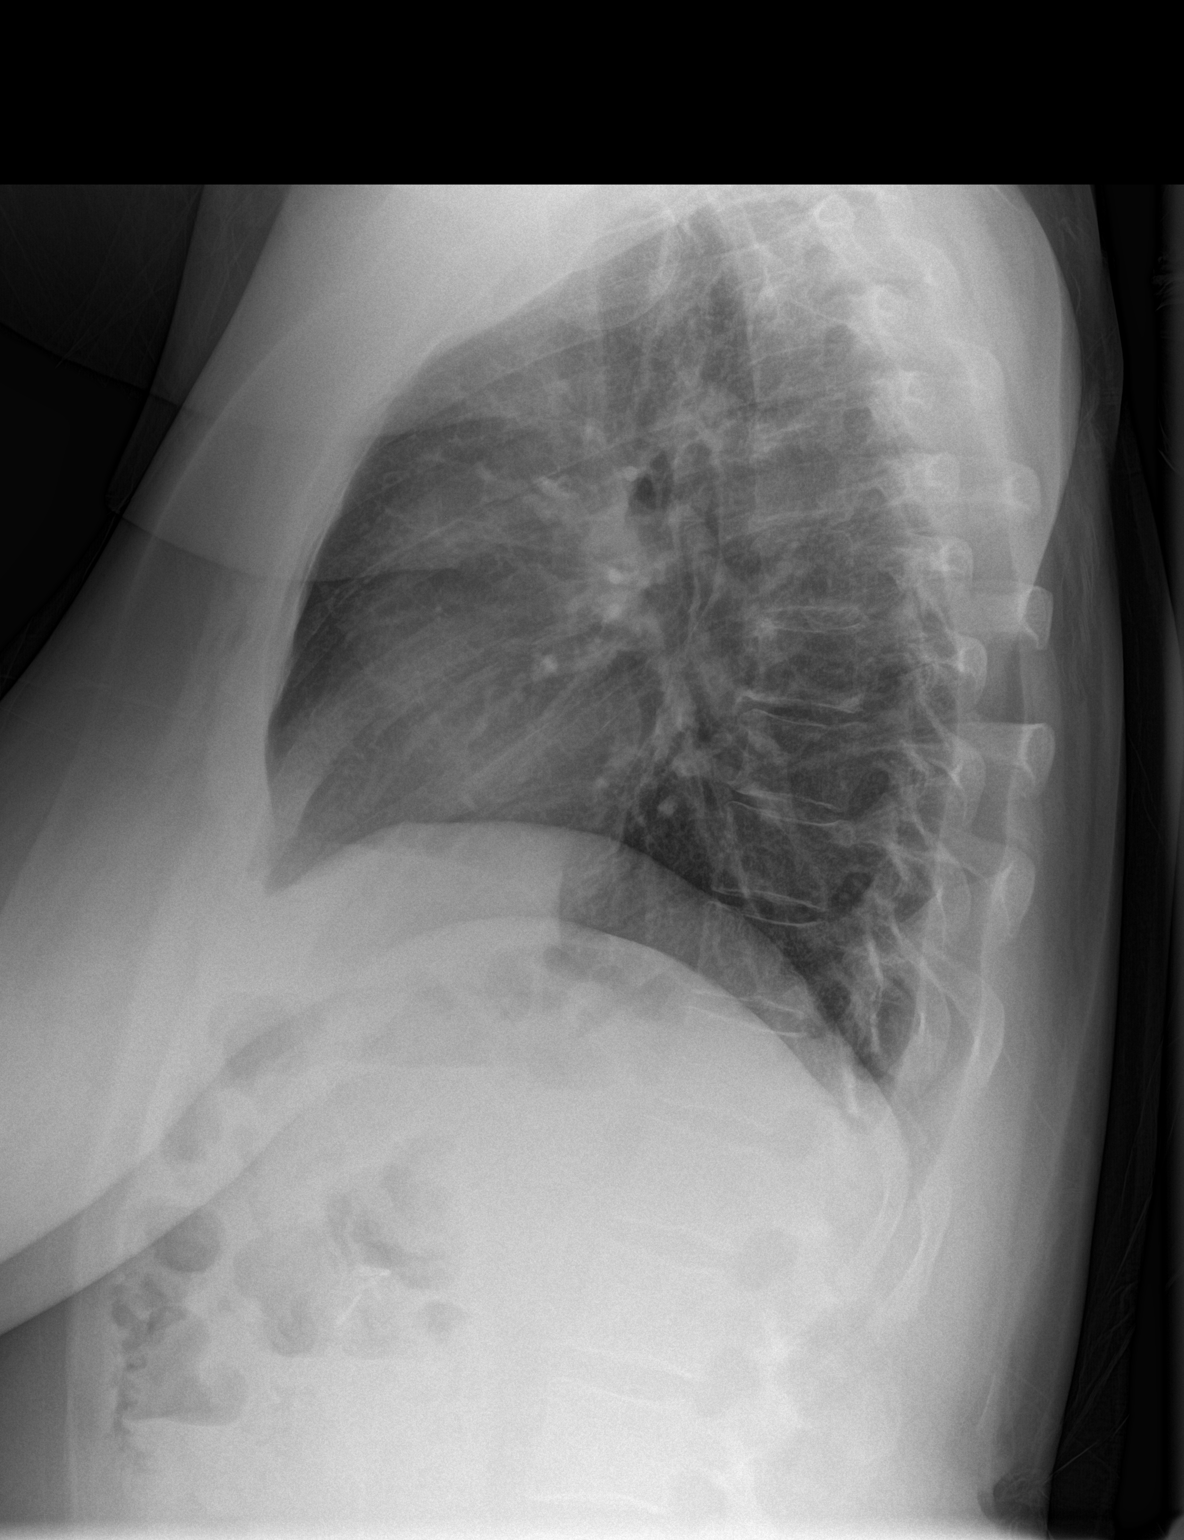

[2 of 2 positions shown; findings below may reference images not displayed]

FINDINGS: The lungs are adequately inflated and clear. The heart and pulmonary
vascularity are normal. The mediastinum is normal in width. The
trachea is midline. There is no pleural effusion. The bony thorax is
unremarkable.
IMPRESSION: There is no active cardiopulmonary disease.

## 2015-07-22 ENCOUNTER — Emergency Department
Admission: EM | Admit: 2015-07-22 | Discharge: 2015-07-22 | Disposition: A | Discharge status: Home / Self Care | Payer: Managed Care, Other (non HMO) | Attending: Emergency Medicine | Admitting: Emergency Medicine

## 2015-07-22 ENCOUNTER — Emergency Department: Payer: Managed Care, Other (non HMO)

## 2015-07-22 DIAGNOSIS — Z79899 Other long term (current) drug therapy: Secondary | ICD-10-CM | POA: Insufficient documentation

## 2015-07-22 DIAGNOSIS — W01198A Fall on same level from slipping, tripping and stumbling with subsequent striking against other object, initial encounter: Secondary | ICD-10-CM | POA: Insufficient documentation

## 2015-07-22 DIAGNOSIS — Y998 Other external cause status: Secondary | ICD-10-CM | POA: Diagnosis not present

## 2015-07-22 DIAGNOSIS — F419 Anxiety disorder, unspecified: Secondary | ICD-10-CM | POA: Diagnosis not present

## 2015-07-22 DIAGNOSIS — R0789 Other chest pain: Secondary | ICD-10-CM

## 2015-07-22 DIAGNOSIS — Y9389 Activity, other specified: Secondary | ICD-10-CM | POA: Insufficient documentation

## 2015-07-22 DIAGNOSIS — Y9289 Other specified places as the place of occurrence of the external cause: Secondary | ICD-10-CM | POA: Insufficient documentation

## 2015-07-22 DIAGNOSIS — S299XXA Unspecified injury of thorax, initial encounter: Secondary | ICD-10-CM | POA: Diagnosis present

## 2015-07-22 DIAGNOSIS — S29001A Unspecified injury of muscle and tendon of front wall of thorax, initial encounter: Secondary | ICD-10-CM | POA: Insufficient documentation

## 2015-07-22 HISTORY — DX: Disorder of thyroid, unspecified: E07.9

## 2015-07-22 LAB — CBC WITH DIFFERENTIAL/PLATELET
Basophils Absolute: 0.1 10*3/uL (ref 0–0.1)
Basophils Relative: 1 %
Eosinophils Absolute: 0.2 10*3/uL (ref 0–0.7)
Eosinophils Relative: 1 %
HCT: 38.1 % (ref 35.0–47.0)
Hemoglobin: 12.5 g/dL (ref 12.0–16.0)
LYMPHS ABS: 2.7 10*3/uL (ref 1.0–3.6)
LYMPHS PCT: 19 %
MCH: 29 pg (ref 26.0–34.0)
MCHC: 32.8 g/dL (ref 32.0–36.0)
MCV: 88.5 fL (ref 80.0–100.0)
MONO ABS: 1 10*3/uL — AB (ref 0.2–0.9)
MONOS PCT: 7 %
Neutro Abs: 10.1 10*3/uL — ABNORMAL HIGH (ref 1.4–6.5)
Neutrophils Relative %: 72 %
PLATELETS: 218 10*3/uL (ref 150–440)
RBC: 4.31 MIL/uL (ref 3.80–5.20)
RDW: 13.6 % (ref 11.5–14.5)
WBC: 14.1 10*3/uL — AB (ref 3.6–11.0)

## 2015-07-22 LAB — COMPREHENSIVE METABOLIC PANEL
ALT: 10 U/L — AB (ref 14–54)
ANION GAP: 8 (ref 5–15)
AST: 14 U/L — ABNORMAL LOW (ref 15–41)
Albumin: 3.4 g/dL — ABNORMAL LOW (ref 3.5–5.0)
Alkaline Phosphatase: 52 U/L (ref 38–126)
BUN: 16 mg/dL (ref 6–20)
CALCIUM: 7.7 mg/dL — AB (ref 8.9–10.3)
CO2: 20 mmol/L — ABNORMAL LOW (ref 22–32)
CREATININE: 0.68 mg/dL (ref 0.44–1.00)
Chloride: 111 mmol/L (ref 101–111)
GFR calc Af Amer: >60 mL/min (ref >60)
Glucose, Bld: 83 mg/dL (ref 65–99)
Potassium: 3.4 mmol/L — ABNORMAL LOW (ref 3.5–5.1)
Sodium: 139 mmol/L (ref 135–145)
Total Bilirubin: 0.5 mg/dL (ref 0.3–1.2)
Total Protein: 6.1 g/dL — ABNORMAL LOW (ref 6.5–8.1)

## 2015-07-22 LAB — TROPONIN I

## 2015-07-22 IMAGING — CR DG CHEST 2V
2 series · 2 of 2 positions shown · non-contrast
Comparison: 09/24/2014

CLINICAL DATA: Shortness of Breath, chest pain

EXAM:
CHEST  2 VIEW

[chest pa]
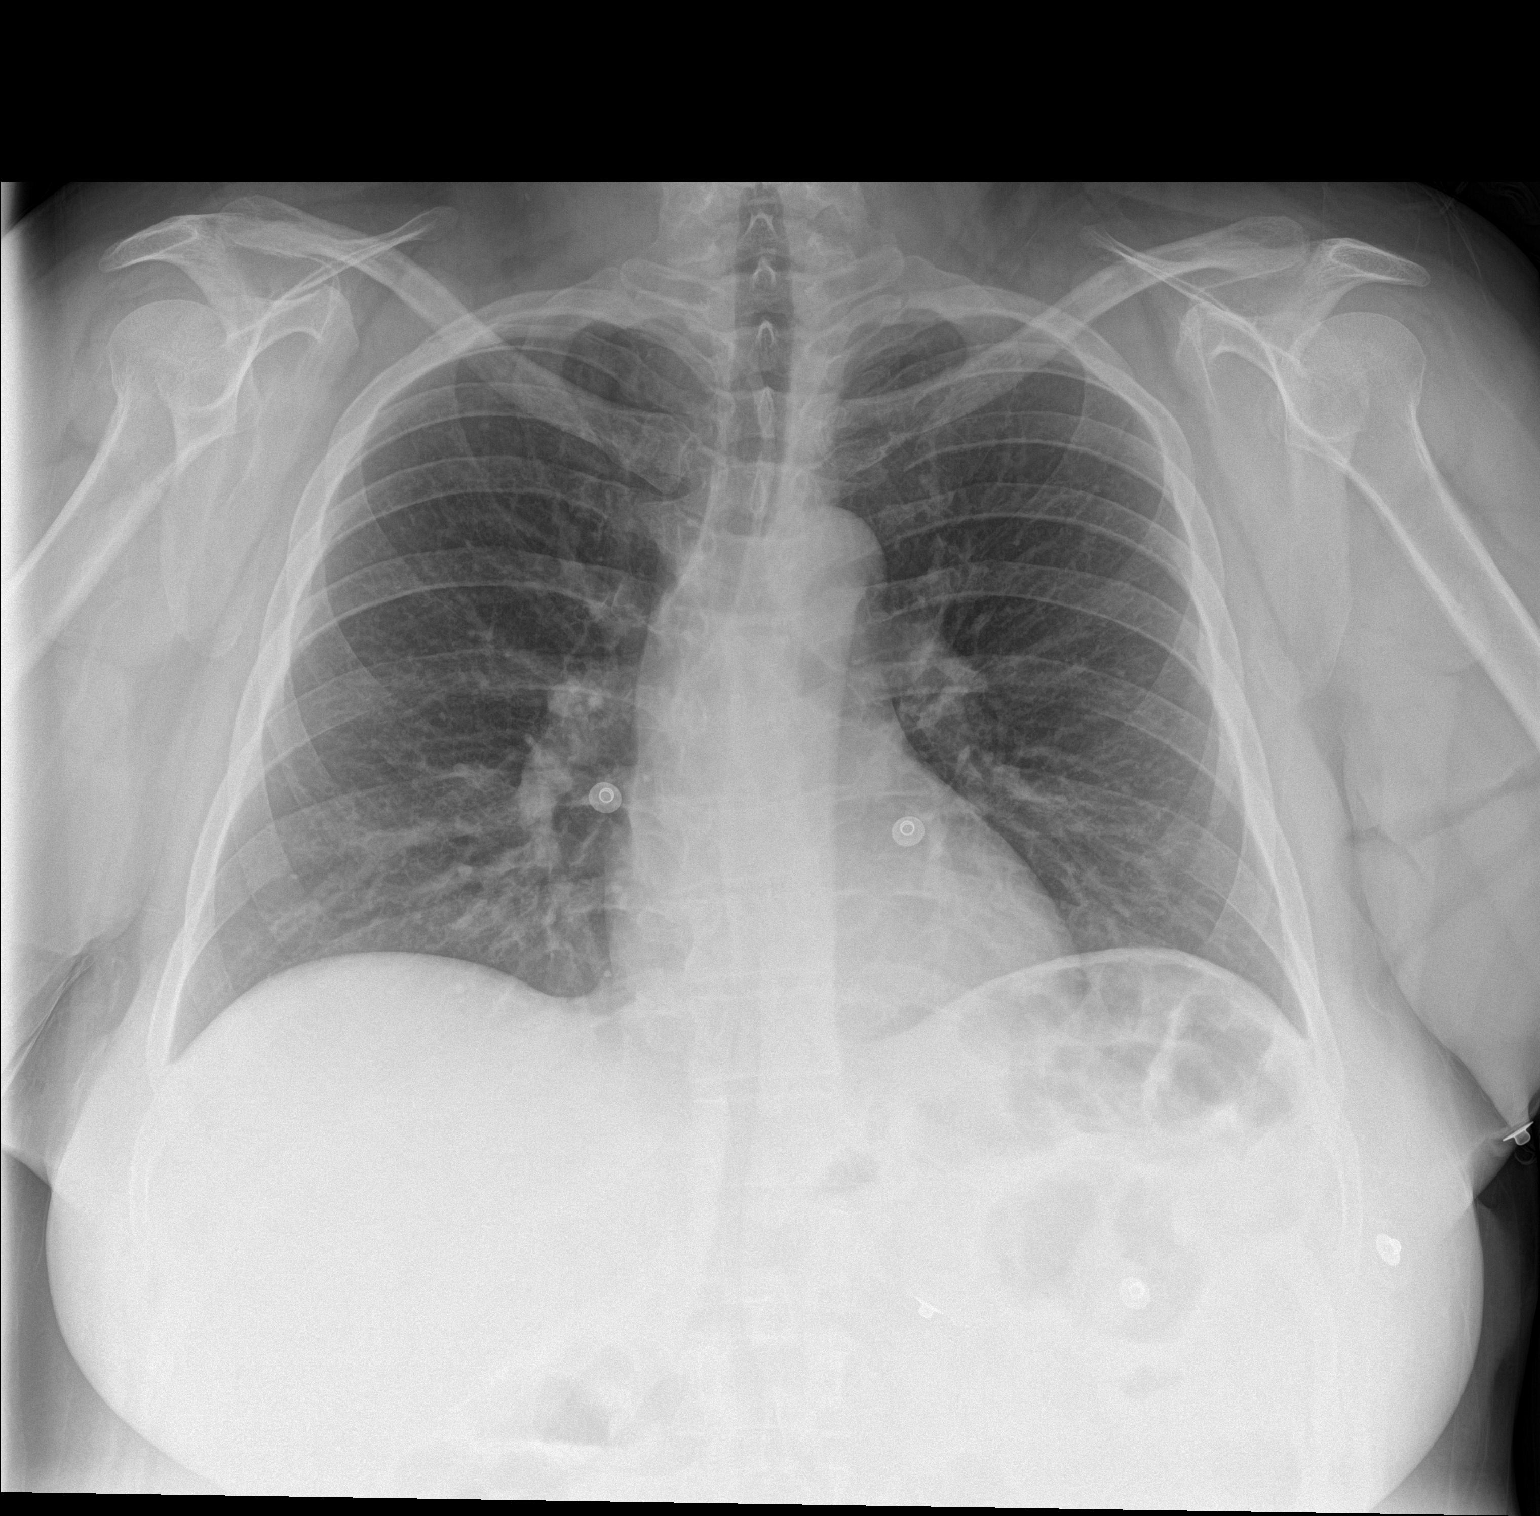

[chest lat]
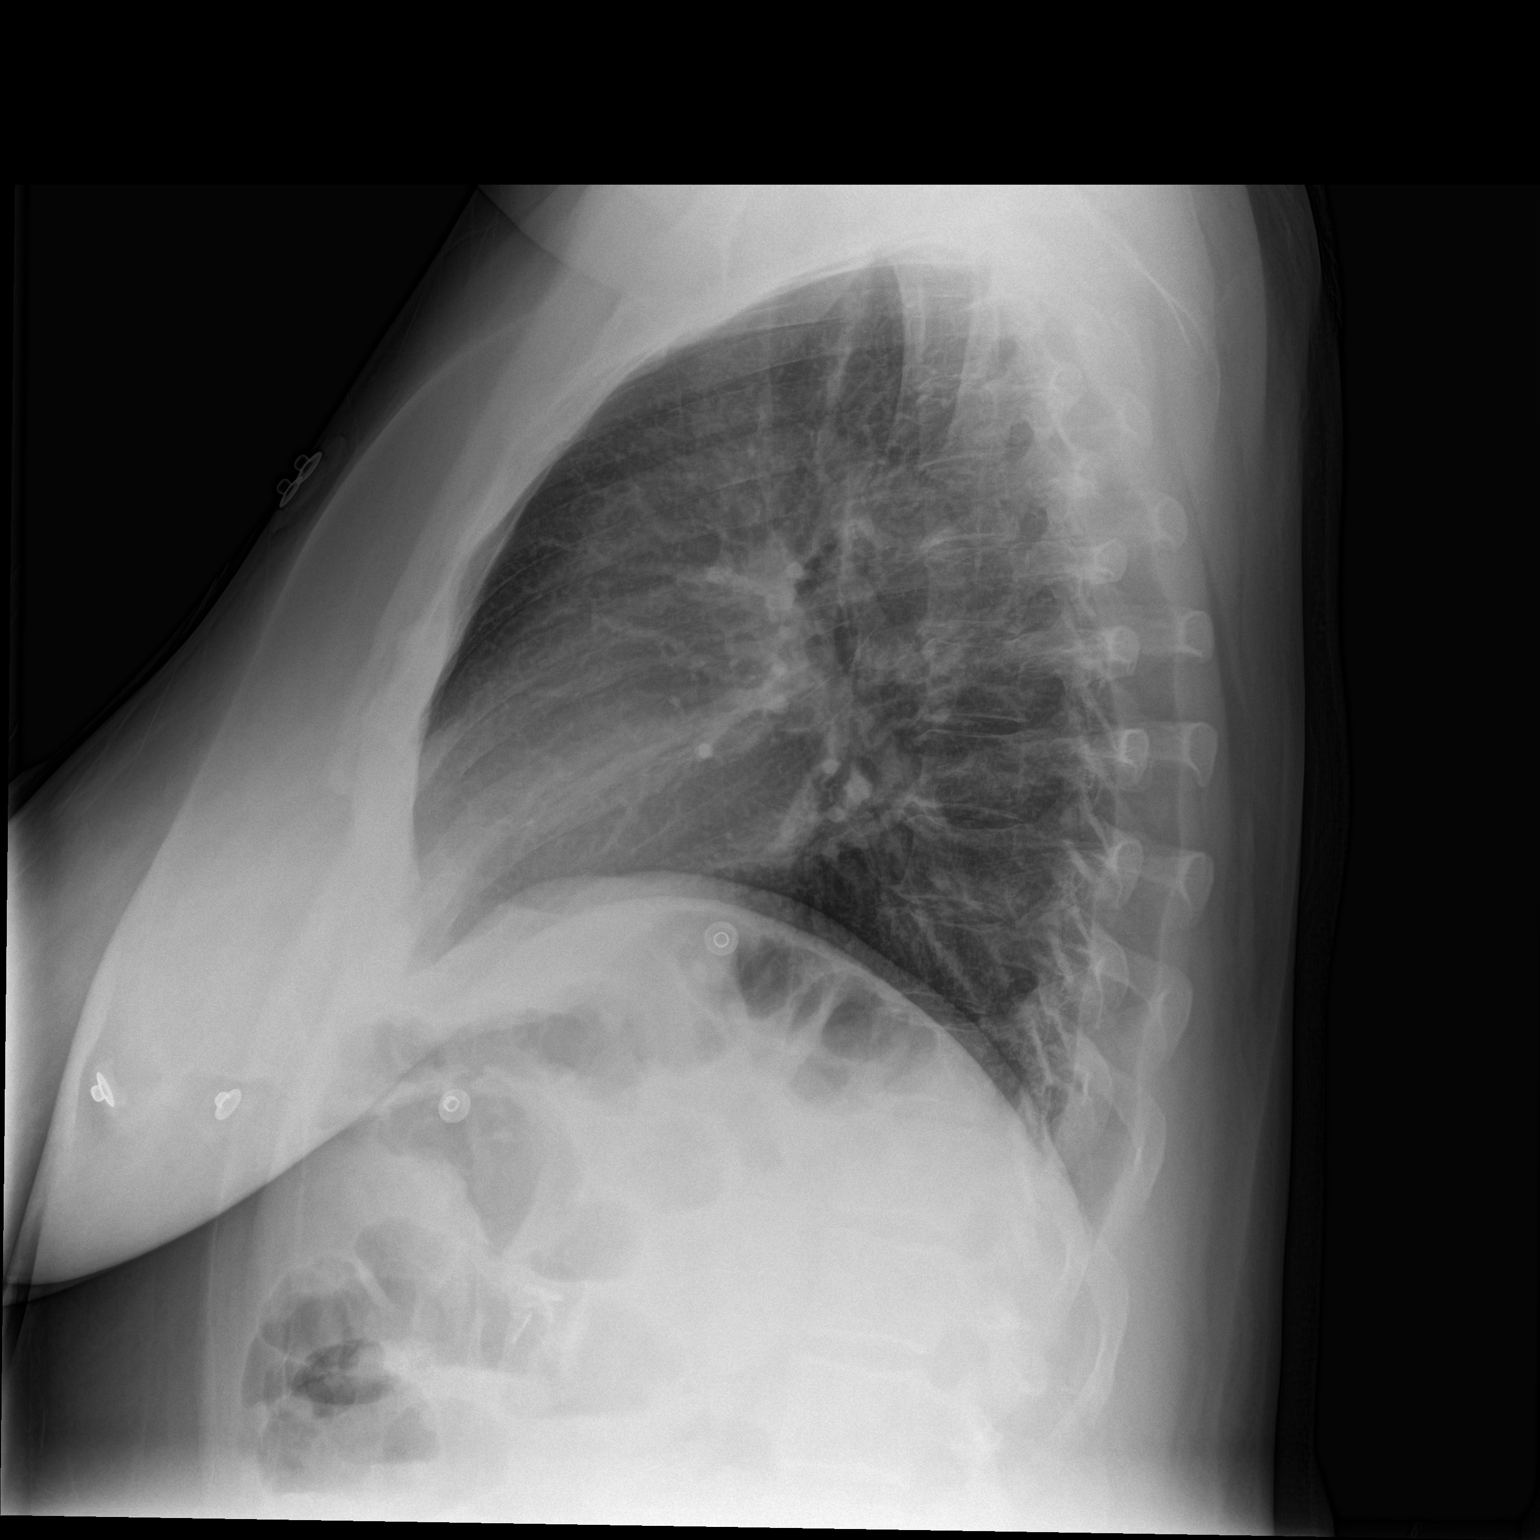

[2 of 2 positions shown; findings below may reference images not displayed]

FINDINGS: Cardiomediastinal silhouette is stable. Mild mid thoracic
dextroscoliosis. No acute infiltrate or pleural effusion. No
pulmonary edema. Minimal degenerative changes mid and lower thoracic
spine.
IMPRESSION: No active cardiopulmonary disease. Mild mid thoracic
dextroscoliosis.

## 2015-07-22 MED ORDER — SODIUM CHLORIDE 0.9 % IV BOLUS (SEPSIS)
1000.0000 mL | Freq: Once | INTRAVENOUS | Status: AC
  Administered 2015-07-22: 1000 mL via INTRAVENOUS

## 2015-07-22 MED ORDER — CYCLOBENZAPRINE HCL 5 MG PO TABS: 5.0000 mg | ORAL_TABLET | Freq: Three times a day (TID) | ORAL | Status: AC | PRN

## 2015-07-22 MED ORDER — IBUPROFEN 800 MG PO TABS: 800.0000 mg | ORAL_TABLET | Freq: Three times a day (TID) | ORAL | Status: DC | PRN

## 2015-07-22 MED ORDER — KETOROLAC TROMETHAMINE 30 MG/ML IJ SOLN
30.0000 mg | Freq: Once | INTRAMUSCULAR | Status: AC
  Administered 2015-07-22: 30 mg via INTRAVENOUS
  Filled 2015-07-22: qty 1

## 2015-07-22 NOTE — ED Notes (Signed)
Pt came via EMS c/o SOB. Pt was at work when started feeling sob, and stated that it hurt to take a deep breath. Pt reported falling Sunday and hitting her chest. Pt c/o numbness and tingling in arms with EMS. Pt reports it is not as bad now. Pt satting 100% r/a. Respirations even and unlabored.

## 2015-07-22 NOTE — ED Provider Notes (Signed)
CSN: [id number]     Arrival date & time 07/22/15  0865 History   First MD Initiated Contact with Patient 07/22/15 [phone]     Chief Complaint  Patient presents with  . Shortness of Breath     (Consider location/radiation/quality/duration/timing/severity/associated sxs/prior Treatment) The history is provided by the patient.  KINETA FUDALA is a 45 y.o. female history of hypothyroidism here presenting with left-sided chest pain, shortness of breath. Patient states that she did fall and hit the left side of her chest while fishing 3 days ago. Patient states that she was at work this morning and felt short of breath and had some trouble breathing. She states that it is worse when she takes a deep breath. Had some numbness and tingling her hands but now feels better. She states that she fell a little anxious as well. Has a history of anxiety but is not taking any medicines currently for anxiety. She is not a smoker no history of hypertension or diabetes and no family history of MI.    Past Medical History  Diagnosis Date  . Thyroid disease    Past Surgical History  Procedure Laterality Date  . Cesarean section    . Dilation and curettage of uterus    . Gastric bypass     No family history on file. Social History  Substance Use Topics  . Smoking status: Never Smoker   . Smokeless tobacco: None  . Alcohol Use: No   OB History    No data available     Review of Systems  Respiratory: Positive for shortness of breath.   All other systems reviewed and are negative.     Allergies  Codeine and Contrast media  Home Medications   Prior to Admission medications   Medication Sig Start Date End Date Taking? Authorizing Provider  levothyroxine (SYNTHROID, LEVOTHROID) 88 MCG tablet Take 1 tablet by mouth daily. 07/21/15  Yes Historical Provider, MD  omeprazole (PRILOSEC) 20 MG capsule Take 1 capsule by mouth 2 (two) times daily. 07/20/15  Yes Historical Provider, MD   BP 120/83 mmHg   Pulse 60  Temp(Src) 97.5 F (36.4 C) (Oral)  Resp 20  Ht 5' (1.524 m)  Wt 175 lb (79.379 kg)  BMI 34.18 kg/m2  SpO2 100% Physical Exam  Constitutional: She is oriented to person, place, and time.  Slightly anxious   HENT:  Head: Normocephalic.  Mouth/Throat: Oropharynx is clear and moist.  Eyes: Conjunctivae are normal. Pupils are equal, round, and reactive to light.  Neck: Normal range of motion. Neck supple.  Cardiovascular: Normal rate, regular rhythm and normal heart sounds.   Pulmonary/Chest: Effort normal and breath sounds normal. No respiratory distress. She has no wheezes.  + L sided chest tenderness, no obvious bruise   Abdominal: Soft. Bowel sounds are normal. She exhibits no distension. There is no tenderness. There is no rebound and no guarding.  Musculoskeletal: Normal range of motion. She exhibits no edema or tenderness.  Neurological: She is alert and oriented to person, place, and time. No cranial nerve deficit. Coordination normal.  Skin: Skin is warm and dry.  Psychiatric: She has a normal mood and affect. Her behavior is normal. Judgment and thought content normal.  Nursing note and vitals reviewed.   ED Course  Procedures (including critical care time) Labs Review Labs Reviewed  CBC WITH DIFFERENTIAL/PLATELET - Abnormal; Notable for the following:    WBC 14.1 (*)    Neutro Abs 10.1 (*)    Monocytes  Absolute 1.0 (*)    All other components within normal limits  COMPREHENSIVE METABOLIC PANEL - Abnormal; Notable for the following:    Potassium 3.4 (*)    CO2 20 (*)    Calcium 7.7 (*)    Total Protein 6.1 (*)    Albumin 3.4 (*)    AST 14 (*)    ALT 10 (*)    All other components within normal limits  TROPONIN I    Imaging Review Dg Chest 2 View  07/22/2015  CLINICAL DATA:  Shortness of Breath, chest pain EXAM: CHEST  2 VIEW COMPARISON:  09/24/2014 FINDINGS: Cardiomediastinal silhouette is stable. Mild mid thoracic dextroscoliosis. No acute infiltrate  or pleural effusion. No pulmonary edema. Minimal degenerative changes mid and lower thoracic spine. IMPRESSION: No active cardiopulmonary disease. Mild mid thoracic dextroscoliosis. Electronically Signed   By: Natasha Mead M.D.   On: 07/22/2015 10:35   I have personally reviewed and evaluated these images and lab results as part of my medical decision-making.   EKG Interpretation None      ED ECG REPORT I, YAO, DAVID, the attending physician, personally viewed and interpreted this ECG.   Date: 07/22/2015  EKG Time:09:58  Rate: 59  Rhythm: normal EKG, normal sinus rhythm  Axis: normal  Intervals:none  ST&T Change: nonspecific    MDM   Final diagnoses:  None    MIRYAM MCELHINNEY is a 45 y.o. female here with chest pain. Pain is reproducible. Patient not tachycardic and I doubt PE. I think likely musculoskeletal. I doubt ACS. Will get labs, CXR. Will give toradol and reassess.  12:52 PM Bicarb 20, but AG nl. Pain improved with toradol. Likely MSK. Will dc home with motrin, flexeril.      Richardean Canal, MD 07/22/15 1254

## 2015-07-22 NOTE — Discharge Instructions (Signed)
Take motrin every 6 hrs for pain.  Take flexeril for muscle strain.  Avoid heavy lifting.   See your doctor.   Return to ER if you have severe chest pain, shortness of breath.

## 2016-03-01 ENCOUNTER — Other Ambulatory Visit: Payer: Self-pay | Admitting: Adult Health

## 2016-03-01 DIAGNOSIS — R0989 Other specified symptoms and signs involving the circulatory and respiratory systems: Secondary | ICD-10-CM

## 2016-03-03 ENCOUNTER — Ambulatory Visit
Admission: RE | Admit: 2016-03-03 | Discharge: 2016-03-03 | Disposition: A | Discharge status: Home / Self Care | Payer: Managed Care, Other (non HMO) | Source: Ambulatory Visit | Attending: Adult Health | Admitting: Adult Health

## 2016-03-03 DIAGNOSIS — K449 Diaphragmatic hernia without obstruction or gangrene: Secondary | ICD-10-CM | POA: Insufficient documentation

## 2016-03-03 DIAGNOSIS — R0989 Other specified symptoms and signs involving the circulatory and respiratory systems: Secondary | ICD-10-CM | POA: Diagnosis present

## 2016-03-03 DIAGNOSIS — Z9889 Other specified postprocedural states: Secondary | ICD-10-CM | POA: Diagnosis not present

## 2016-03-03 DIAGNOSIS — K219 Gastro-esophageal reflux disease without esophagitis: Secondary | ICD-10-CM | POA: Insufficient documentation

## 2016-03-03 IMAGING — RF DG ESOPHAGUS
10 of 12 series · 14 of 17 positions shown · non-contrast
Comparison: COMPARISON :
chest x-ray 07/22/2015.

CLINICAL DATA: Choking sensation .

EXAM:
ESOPHOGRAM / BARIUM SWALLOW / BARIUM TABLET STUDY
TECHNIQUE: Combined double contrast and single contrast examination performed
using effervescent crystals, thick barium liquid, and thin barium
liquid. The patient was observed with fluoroscopy swallowing a 13 mm
barium sulphate tablet.
FLUOROSCOPY TIME:  Fluoroscopy Time:  1 minutes 12 seconds
Radiation Exposure Index (if provided by the fluoroscopic device):
48.5 mGy
Number of Acquired Spot Images: 20

[Series 1: fluoro_barium 2fps_bw · 0.18mm/px · 2 of 4 frames shown (1 of 10)]
[frame 1/4]
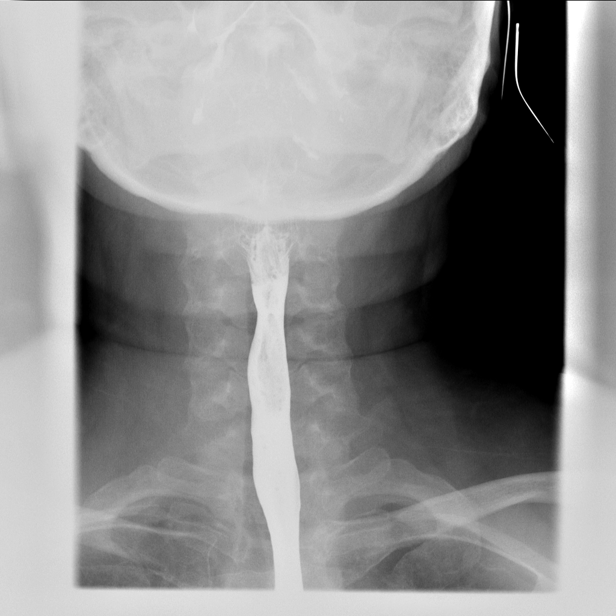
[frame 3/4]
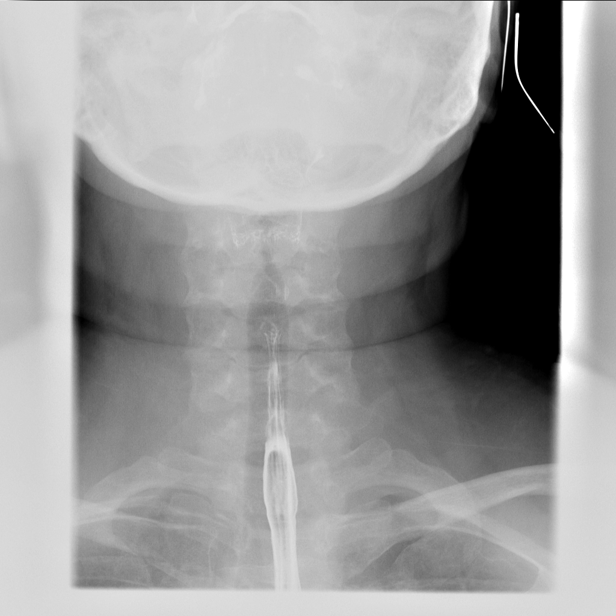

[Series 2: fluoro_barium 2fps_bw · 0.18mm/px · 3 of 6 frames shown (2 of 10)]
[frame 1/6]
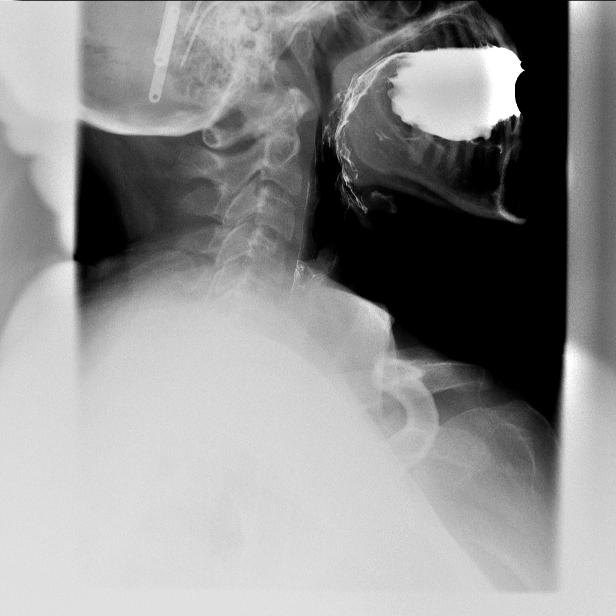
[frame 4/6]
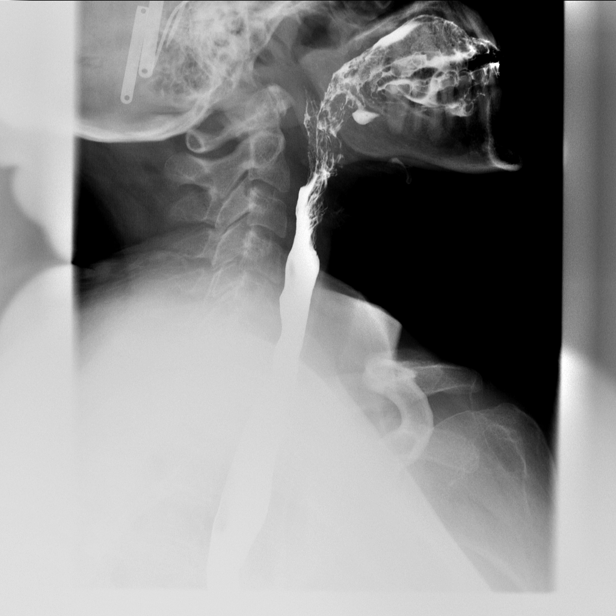
[frame 6/6]
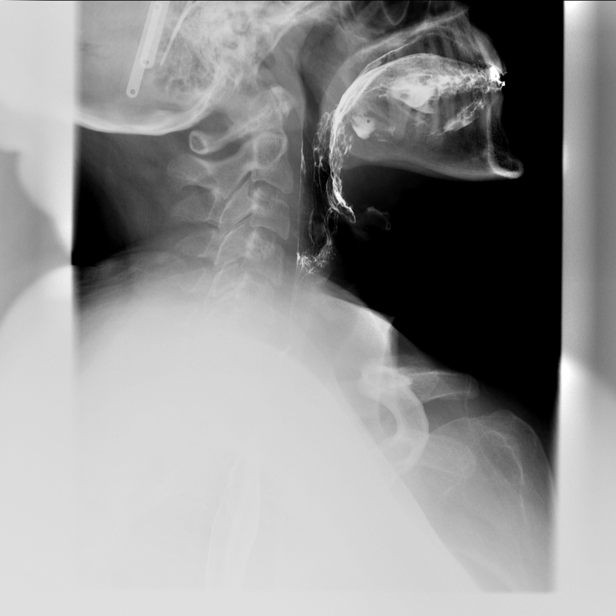

[Series 3: fluoro_barium 2fps_bw · 0.18mm/px · 1 of 1 slices shown (3 of 10)]
[im 1/1]
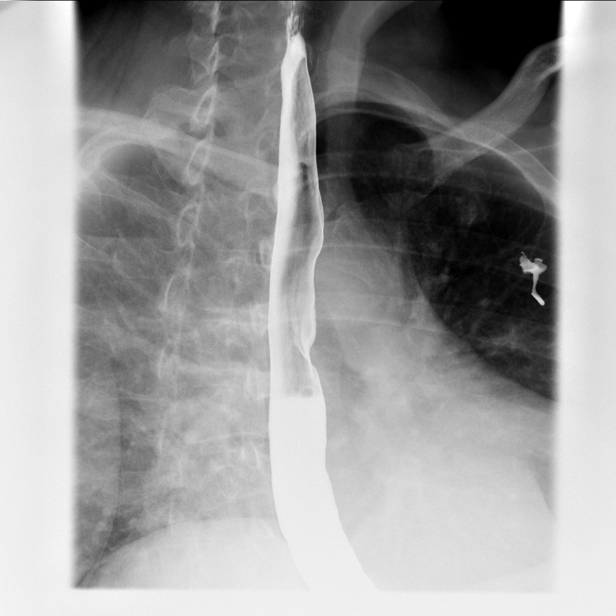

[Series 4: fluoro_barium 2fps_bw · 0.18mm/px · 1 of 1 slices shown (4 of 10)]
[im 1/1]
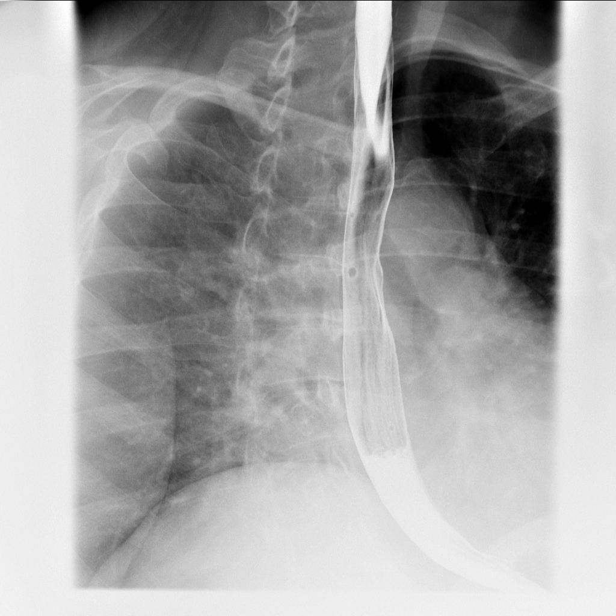

[Series 6: fluoro_barium 2fps_bw · 0.18mm/px · 1 of 1 slices shown (5 of 10)]
[im 1/1]
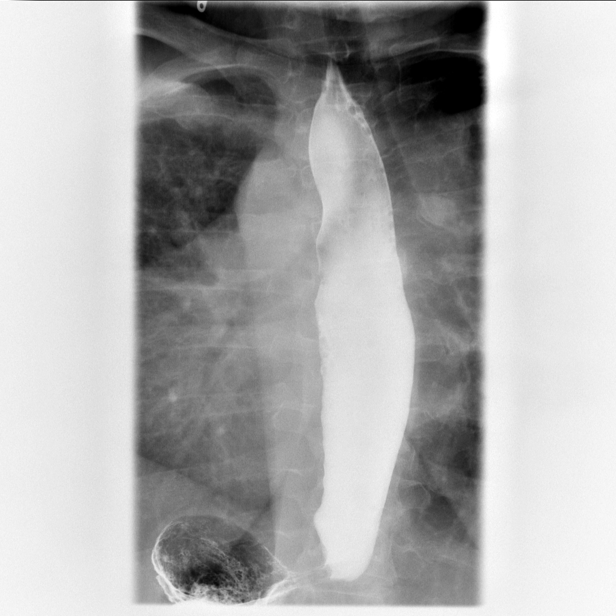

[Series 7: fluoro_barium 2fps_bw · 0.18mm/px · 1 of 1 slices shown (6 of 10)]
[im 1/1]
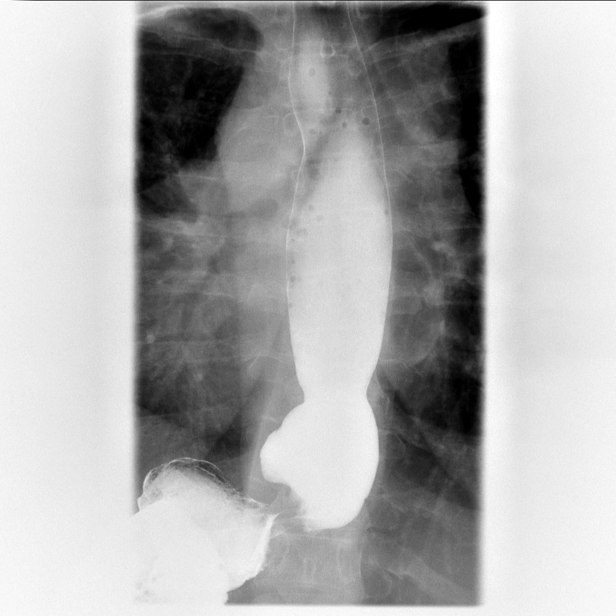

[Series 8: fluoro_barium 2fps_bw · 0.18mm/px · 1 of 1 slices shown (7 of 10)]
[im 1/1]
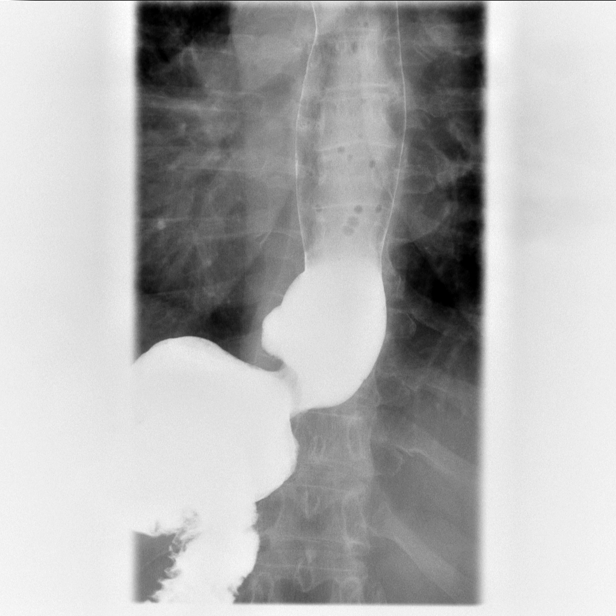

[Series 9: fluoro_barium 2fps_bw · 0.18mm/px · 1 of 1 slices shown (8 of 10)]
[im 1/1]
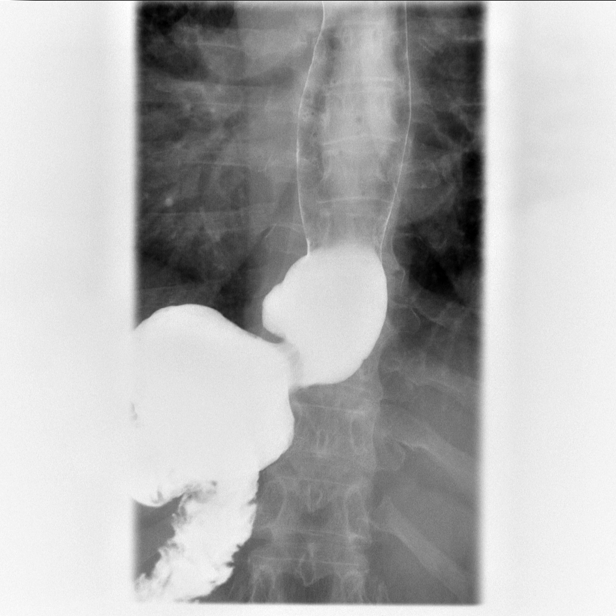

[Series 10: fluoro_barium 2fps_bw · 0.18mm/px · 1 of 1 slices shown (9 of 10)]
[im 1/1]
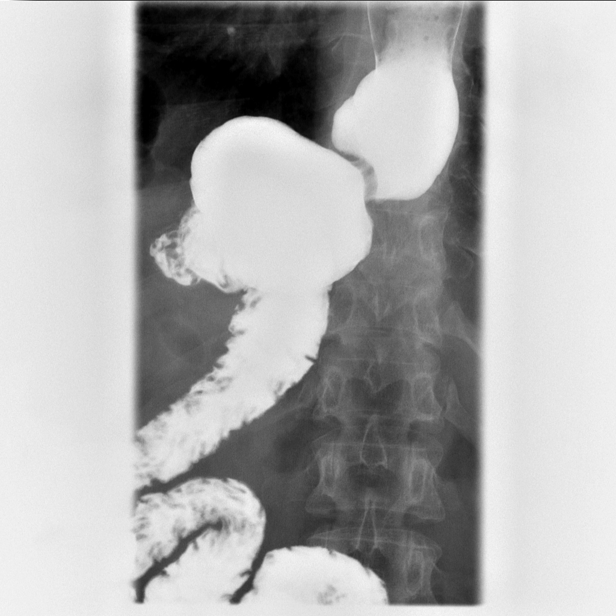

[Series 12: fluoro_barium 2fps_bw · 0.18mm/px · 2 of 2 frames shown (10 of 10)]
[frame 1/2]
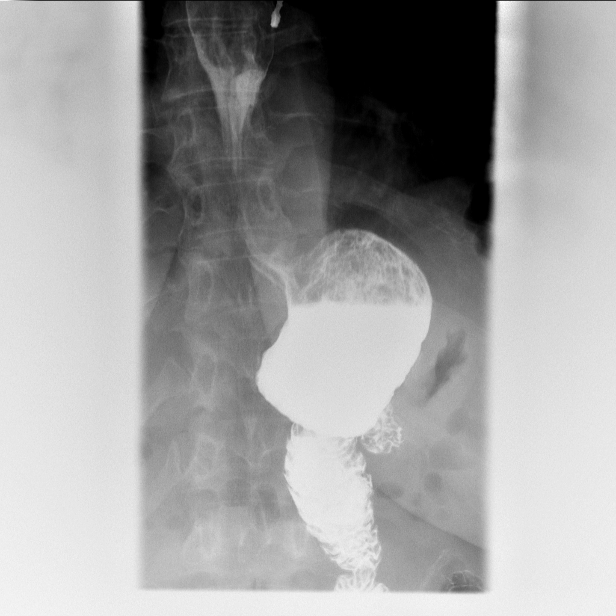
[frame 2/2]
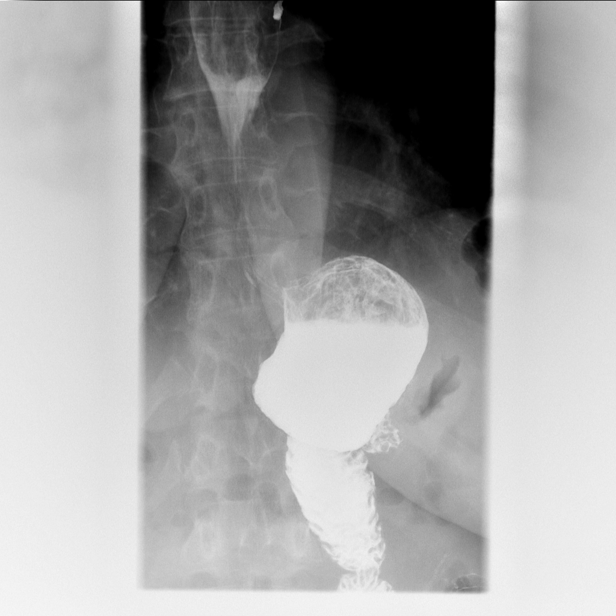

[14 of 17 positions shown; findings below may reference images not displayed]

FINDINGS: Cervical esophagus appears normal. Sliding hiatal hernia is present.
Gastroesophageal reflux noted. Standardized barium tablet passed
normally. Postsurgical changes noted of the stomach. Gastric outflow
tract is widely patent.
IMPRESSION: Sliding hiatal hernia with prominent gastroesophageal reflux. No
obstructing esophageal lesions. Postsurgical changes of the stomach.

## 2016-08-22 ENCOUNTER — Emergency Department
Admission: EM | Admit: 2016-08-22 | Discharge: 2016-08-22 | Disposition: A | Discharge status: Home / Self Care | Payer: BLUE CROSS/BLUE SHIELD | Attending: Emergency Medicine | Admitting: Emergency Medicine

## 2016-08-22 ENCOUNTER — Encounter: Payer: Self-pay | Admitting: Medical Oncology

## 2016-08-22 ENCOUNTER — Emergency Department: Payer: BLUE CROSS/BLUE SHIELD

## 2016-08-22 DIAGNOSIS — J0100 Acute maxillary sinusitis, unspecified: Secondary | ICD-10-CM

## 2016-08-22 DIAGNOSIS — J069 Acute upper respiratory infection, unspecified: Secondary | ICD-10-CM | POA: Diagnosis not present

## 2016-08-22 DIAGNOSIS — R05 Cough: Secondary | ICD-10-CM | POA: Diagnosis present

## 2016-08-22 IMAGING — CR DG CHEST 2V
1 series · 2 of 2 positions shown · non-contrast
Comparison: 07/22/2015

CLINICAL DATA: Cough and nasal congestion for 6 weeks.

EXAM:
CHEST  2 VIEW

[Series 1: w chest pa · 0.14mm/px · 2 of 2 slices shown]
[im 1/2]
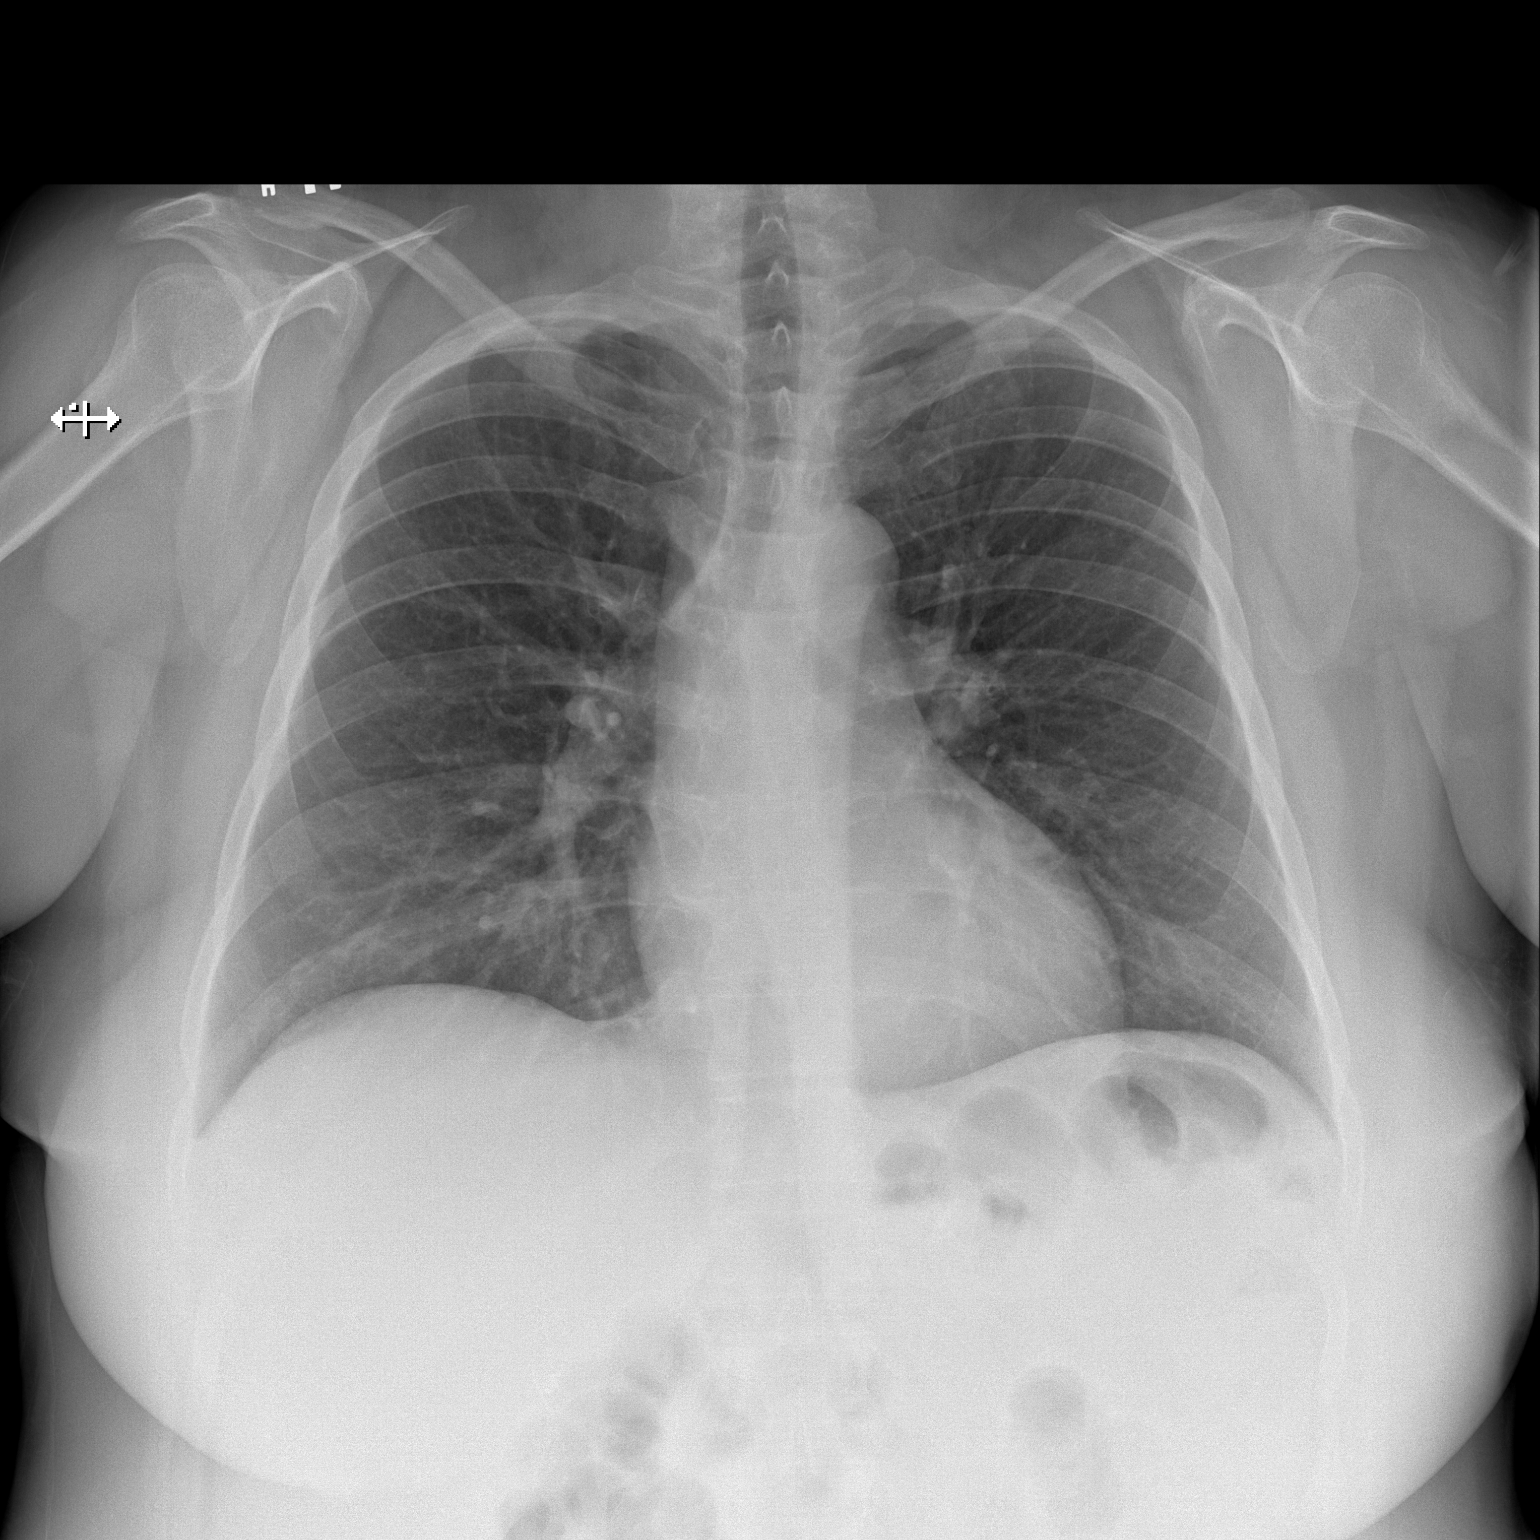
[im 2/2]
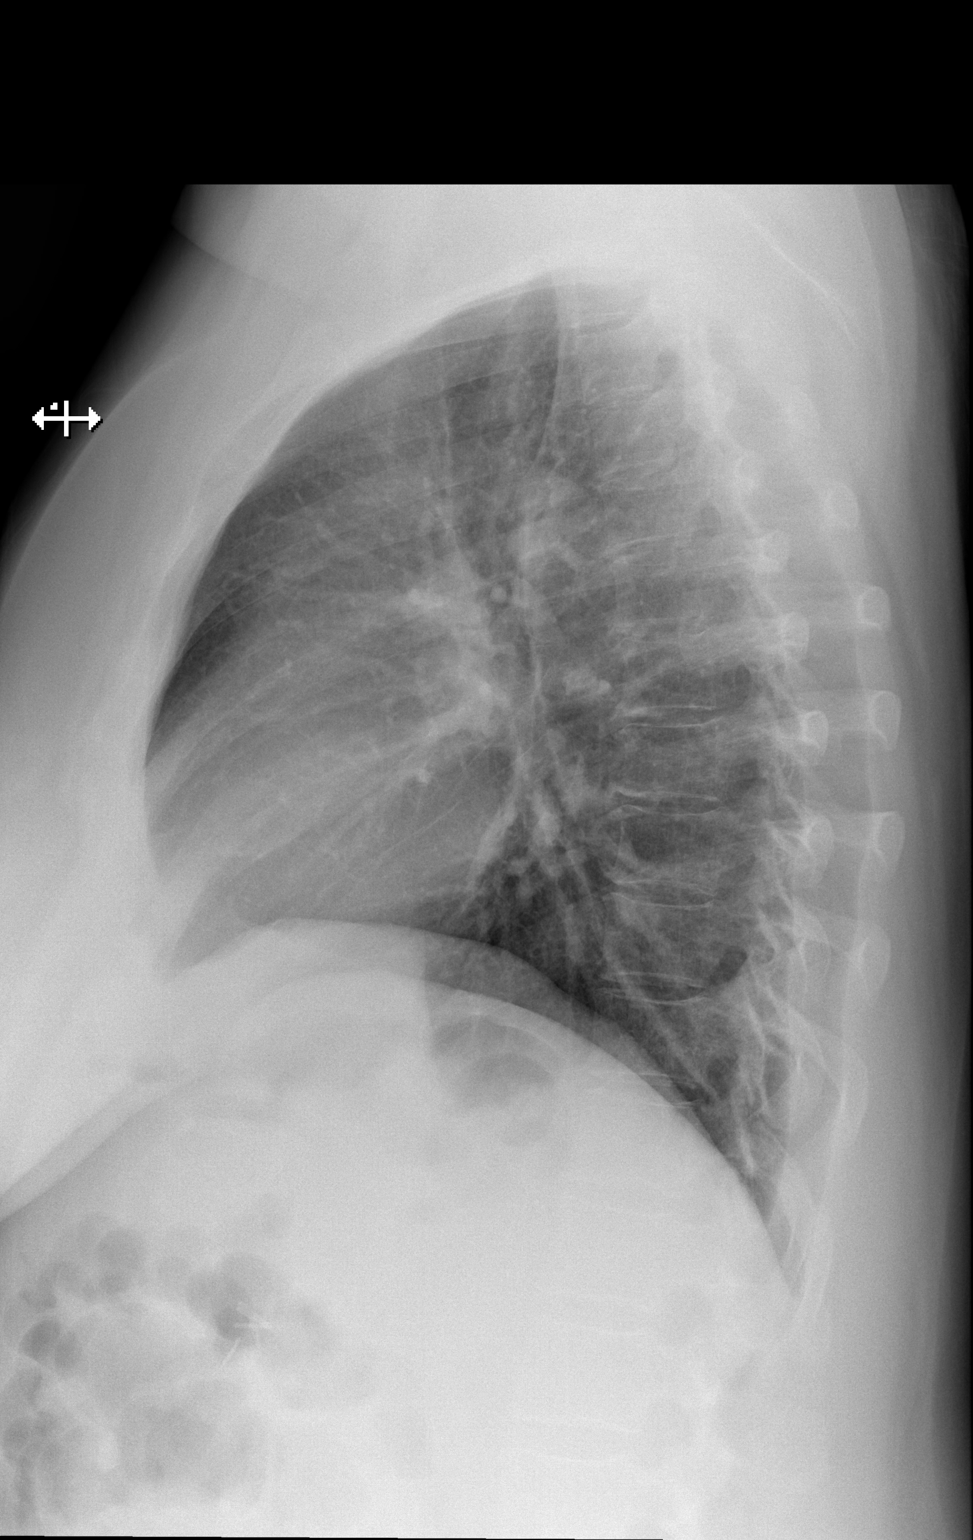

[2 of 2 positions shown; findings below may reference images not displayed]

FINDINGS: The heart size and mediastinal contours are within normal limits.
Both lungs are clear. The visualized skeletal structures are
unremarkable.
IMPRESSION: No active cardiopulmonary disease.

## 2016-08-22 MED ORDER — AMOXICILLIN-POT CLAVULANATE 875-125 MG PO TABS: 1.0000 | ORAL_TABLET | Freq: Two times a day (BID) | ORAL | 0 refills | Status: AC

## 2016-08-22 MED ORDER — BENZONATATE 100 MG PO CAPS: ORAL_CAPSULE | ORAL | 0 refills | Status: DC

## 2016-08-22 NOTE — ED Triage Notes (Signed)
Pt reports cough for over a month with nasal congestion.

## 2016-08-22 NOTE — ED Notes (Signed)
Pt alert and oriented X4, active, cooperative, pt in NAD. RR even and unlabored, color WNL.  Pt informed to return if any life threatening symptoms occur.   

## 2016-08-22 NOTE — Discharge Instructions (Signed)
Follow up with your primary care doctor if any continued problems. Begin taking Augmentin 875 twice a day for 10 days. Continue Tessalon Perles as needed for cough. Increase fluids. Continue your nasal spray as needed for nasal congestion. You may also use saline nose spray over-the-counter for nasal congestion. Take Tylenol or ibuprofen if needed for muscle aches or headache.

## 2016-08-22 NOTE — ED Notes (Signed)
See triage note  States she developed cough with nasal drainage about 1 month ago  Has been seen times 2   conts to have cough  No fever

## 2016-08-22 NOTE — ED Provider Notes (Signed)
[facility] Emergency Department Provider Note   ____________________________________________   First MD Initiated Contact with Patient 08/22/16 [phone]     (approximate)  I have reviewed the triage vital signs and the nursing notes.   HISTORY  Chief Complaint Cough and Nasal Congestion    HPI Mariah Davis is a 46 y.o. female with a complaint of continued cough for 1 month. Patient states that she was seen by her PCP at [facility] where she was placed on doxycycline, Tessalon Perles and a low dose of prednisone. Patient states that she has continued to cough although the Tessalon Perles seemed to help some. Patient is unaware of any fever. Cough is nonproductive. She does complain of some sinus congestion. She has noticed some facial discomfort as well as teeth hurting occasionally. Denies any nausea, vomiting or diarrhea. She denies being a smoker and is to stay away from secondhand smoke. Currently she rates her pain as a 3 out of 10.   Past Medical History:  Diagnosis Date  . Thyroid disease     There are no active problems to display for this patient.   Past Surgical History:  Procedure Laterality Date  . CESAREAN SECTION    . DILATION AND CURETTAGE OF UTERUS    . GASTRIC BYPASS      Prior to Admission medications   Medication Sig Start Date End Date Taking? Authorizing Provider  amoxicillin-clavulanate (AUGMENTIN) 875-125 MG tablet Take 1 tablet by mouth 2 (two) times daily. 08/22/16 08/29/16  Tommi Rumpshonda L Kelani Robart, PA-C  benzonatate (TESSALON PERLES) 100 MG capsule 1-2 every 8 hours prn cough 08/22/16   Tommi Rumpshonda L Ralph Brouwer, PA-C  ibuprofen (ADVIL,MOTRIN) 800 MG tablet Take 1 tablet (800 mg total) by mouth every 8 (eight) hours as needed. 07/22/15   Charlynne Panderavid Hsienta Yao, MD  levothyroxine (SYNTHROID, LEVOTHROID) 88 MCG tablet Take 1 tablet by mouth daily. 07/21/15   Historical Provider, MD  omeprazole (PRILOSEC) 20 MG capsule Take 1 capsule by mouth  2 (two) times daily. 07/20/15   Historical Provider, MD    Allergies Codeine and Contrast media [iodinated diagnostic agents]  No family history on file.  Social History Social History  Substance Use Topics  . Smoking status: Never Smoker  . Smokeless tobacco: Not on file  . Alcohol use No    Review of Systems Constitutional: Subjective fever/chills Eyes: No visual changes. ENT: No sore throat. Positive facial pain. Positive nasal congestion. Cardiovascular: Denies chest pain. Respiratory: Denies shortness of breath. Positive for nonproductive cough. Gastrointestinal: No abdominal pain.  No nausea, no vomiting.  No diarrhea.  Musculoskeletal: Negative for back pain. Skin: Negative for rash. Neurological: Negative for headaches, focal weakness or numbness.  10-point ROS otherwise negative.  ____________________________________________   PHYSICAL EXAM:  VITAL SIGNS: ED Triage Vitals  Enc Vitals Group     BP 08/22/16 0946 119/64     Pulse Rate 08/22/16 0946 77     Resp 08/22/16 0946 19     Temp 08/22/16 0946 97.9 F (36.6 C)     Temp Source 08/22/16 0946 Oral     SpO2 08/22/16 0946 95 %     Weight 08/22/16 0947 210 lb (95.3 kg)     Height 08/22/16 0947 5' (1.524 m)     Head Circumference --      Peak Flow --      Pain Score 08/22/16 0947 3     Pain Loc --      Pain Edu? --  Excl. in GC? --     Constitutional: Alert and oriented. Well appearing and in no acute distress. Eyes: Conjunctivae are normal. PERRL. EOMI. Head: Atraumatic. Nose:Minimal congestion/rhinnorhea. On percussion of the maxillary sinuses there is some slight tenderness. No frontal tenderness was noted. TMs are dull bilaterally. Mouth/Throat: Mucous membranes are moist.  Oropharynx non-erythematous.positive posterior drainage noted. No exudates noted. Neck: No stridor.   Hematological/Lymphatic/Immunilogical: No cervical lymphadenopathy. Cardiovascular: Normal rate, regular rhythm. Grossly  normal heart sounds.  Good peripheral circulation. Respiratory: Normal respiratory effort.  No retractions. Lungs CTAB. Gastrointestinal: Soft and nontender. No distention.  Musculoskeletal: No lower extremity tenderness nor edema.  No joint effusions. Neurologic:  Normal speech and language. No gross focal neurologic deficits are appreciated. No gait instability. Skin:  Skin is warm, dry and intact. No rash noted. Psychiatric: Mood and affect are normal. Speech and behavior are normal.  ____________________________________________   LABS (all labs ordered are listed, but only abnormal results are displayed)  Labs Reviewed - No data to display  RADIOLOGY  X-rays x-ray per radiologist shows no active cardiopulmonary disease. I, Tommi Rumps, personally viewed and evaluated these images (plain radiographs) as part of my medical decision making, as well as reviewing the written report by the radiologist. ____________________________________________   PROCEDURES  Procedure(s) performed: None  Procedures  Critical Care performed: No  ____________________________________________   INITIAL IMPRESSION / ASSESSMENT AND PLAN / ED COURSE  Pertinent labs & imaging results that were available during my care of the patient were reviewed by me and considered in my medical decision making (see chart for details).        ____________________________________________   FINAL CLINICAL IMPRESSION(S) / ED DIAGNOSES  Final diagnoses:  Acute upper respiratory infection  Acute non-recurrent maxillary sinusitis      NEW MEDICATIONS STARTED DURING THIS VISIT:  Discharge Medication List as of 08/22/2016 11:03 AM    START taking these medications   Details  amoxicillin-clavulanate (AUGMENTIN) 875-125 MG tablet Take 1 tablet by mouth 2 (two) times daily., Starting Tue 08/22/2016, Until Tue 08/29/2016, Print    benzonatate (TESSALON PERLES) 100 MG capsule 1-2 every 8 hours prn cough,  Print         Note:  This document was prepared using Dragon voice recognition software and may include unintentional dictation errors.    Tommi Rumps, PA-C 08/22/16 1350    Rockne Menghini, MD 08/22/16 1510

## 2016-12-14 ENCOUNTER — Other Ambulatory Visit: Payer: Self-pay | Admitting: Family Medicine

## 2016-12-14 DIAGNOSIS — Z1231 Encounter for screening mammogram for malignant neoplasm of breast: Secondary | ICD-10-CM

## 2016-12-22 ENCOUNTER — Encounter: Payer: Self-pay | Admitting: Obstetrics and Gynecology

## 2016-12-22 ENCOUNTER — Ambulatory Visit (INDEPENDENT_AMBULATORY_CARE_PROVIDER_SITE_OTHER): Payer: BLUE CROSS/BLUE SHIELD | Admitting: Obstetrics and Gynecology

## 2016-12-22 DIAGNOSIS — Z87410 Personal history of cervical dysplasia: Secondary | ICD-10-CM

## 2016-12-22 NOTE — Progress Notes (Signed)
HPI:      [name] is a 46 y.o. [id number] who LMP was No LMP recorded. Patient has had a hysterectomy.  Subjective:   She presents today Being referred by her general medical doctor because he told her that she had a history of cervical cancer. She previously had a hysterectomy but did not know she had cervical cancer. She reports that she never had a colposcopy. She reports that she had no follow-up after her surgery including no Pap smears.      Hx: The following portions of the patient's history were reviewed and updated as appropriate:             She  has a past medical history of Thyroid disease. She  does not have a problem list on file. She  has a past surgical history that includes Cesarean section; Dilation and curettage of uterus; Gastric bypass; and Abdominal hysterectomy. Her family history includes Stroke in her mother. She  reports that she has never smoked. She has never used smokeless tobacco. She reports that she does not drink alcohol or use drugs. She is allergic to codeine and contrast media [iodinated diagnostic agents].       Review of Systems:  Review of Systems  Constitutional: Denied constitutional symptoms, night sweats, recent illness, fatigue, fever, insomnia and weight loss.  Eyes: Denied eye symptoms, eye pain, photophobia, vision change and visual disturbance.  Ears/Nose/Throat/Neck: Denied ear, nose, throat or neck symptoms, hearing loss, nasal discharge, sinus congestion and sore throat.  Cardiovascular: Denied cardiovascular symptoms, arrhythmia, chest pain/pressure, edema, exercise intolerance, orthopnea and palpitations.  Respiratory: Denied pulmonary symptoms, asthma, pleuritic pain, productive sputum, cough, dyspnea and wheezing.  Gastrointestinal: Denied, gastro-esophageal reflux, melena, nausea and vomiting.  Genitourinary:  Denied genitourinary symptoms including symptomatic vaginal discharge, pelvic relaxation issues, and urinary  complaints.  Musculoskeletal: Denied musculoskeletal symptoms, stiffness, swelling, muscle weakness and myalgia.  Dermatologic: Denied dermatology symptoms, rash and scar.  Neurologic: Denied neurology symptoms, dizziness, headache, neck pain and syncope.  Psychiatric: Denied psychiatric symptoms, anxiety and depression.  Endocrine: Denied endocrine symptoms including hot flashes and night sweats.   Meds:   Current Outpatient Prescriptions on File Prior to Visit  Medication Sig Dispense Refill  . ibuprofen (ADVIL,MOTRIN) 800 MG tablet Take 1 tablet (800 mg total) by mouth every 8 (eight) hours as needed. 30 tablet 0  . levothyroxine (SYNTHROID, LEVOTHROID) 88 MCG tablet Take 100 mcg by mouth daily.   0  . omeprazole (PRILOSEC) 20 MG capsule Take 1 capsule by mouth 2 (two) times daily.  0  . benzonatate (TESSALON PERLES) 100 MG capsule 1-2 every 8 hours prn cough (Patient not taking: Reported on 12/22/2016) 30 capsule 0   No current facility-administered medications on file prior to visit.     Objective:     There were no vitals filed for this visit.            Physical examination   Pelvic:   Vulva: Normal appearance.  No lesions.  Vagina: No lesions or abnormalities noted.  No cuff lesions noted  Support: Normal pelvic support.  Urethra No masses tenderness or scarring.  Meatus Normal size without lesions or prolapse.  Cervix: Surgically absent   Anus: Normal exam.  No lesions.  Perineum: Normal exam.  No lesions.        Bimanual   Uterus: Surgically absent   Adnexae: No masses.  Non-tender to palpation.  Cul-de-sac: Negative for abnormality.     Assessment:  [id number] There are no active problems to display for this patient.    1. History of cervical dysplasia     Patient says cervical cancer that this is unconfirmed.   Plan:            1.  Vaginal cuff Pap performed.  2.  All records sent for. We'll discuss all records in detail at next visit. Orders No  orders of the defined types were placed in this encounter.    Meds ordered this encounter  Medications  . ergocalciferol (VITAMIN D2) 50000 units capsule    Sig: Take 50,000 Units by mouth once a week.        F/U  Return in about 2 weeks (around 01/05/2017).  Elonda Huskyavid J. Evans, M.D. 12/22/2016 9:09 AM

## 2016-12-27 ENCOUNTER — Ambulatory Visit
Admission: RE | Admit: 2016-12-27 | Discharge: 2016-12-27 | Disposition: A | Discharge status: Home / Self Care | Payer: BLUE CROSS/BLUE SHIELD | Source: Ambulatory Visit | Attending: Family Medicine | Admitting: Family Medicine

## 2016-12-27 ENCOUNTER — Other Ambulatory Visit: Payer: Self-pay | Admitting: *Deleted

## 2016-12-27 DIAGNOSIS — Z1231 Encounter for screening mammogram for malignant neoplasm of breast: Secondary | ICD-10-CM | POA: Insufficient documentation

## 2016-12-29 LAB — PAP IG AND HPV HIGH-RISK
HPV, HIGH-RISK: POSITIVE — AB
PAP Smear Comment: 0

## 2017-01-05 ENCOUNTER — Encounter: Payer: Self-pay | Admitting: Obstetrics and Gynecology

## 2017-01-09 ENCOUNTER — Encounter: Payer: BLUE CROSS/BLUE SHIELD | Admitting: Obstetrics and Gynecology

## 2017-01-10 ENCOUNTER — Encounter: Payer: Self-pay | Admitting: Obstetrics and Gynecology

## 2017-01-10 ENCOUNTER — Ambulatory Visit (INDEPENDENT_AMBULATORY_CARE_PROVIDER_SITE_OTHER): Payer: BLUE CROSS/BLUE SHIELD | Admitting: Obstetrics and Gynecology

## 2017-01-10 VITALS — BP 100/67 | HR 69 | Ht 60.0 in | Wt 209.1 lb

## 2017-01-10 DIAGNOSIS — N72 Inflammatory disease of cervix uteri: Secondary | ICD-10-CM | POA: Diagnosis not present

## 2017-01-10 DIAGNOSIS — R87610 Atypical squamous cells of undetermined significance on cytologic smear of cervix (ASC-US): Secondary | ICD-10-CM | POA: Diagnosis not present

## 2017-01-10 DIAGNOSIS — B977 Papillomavirus as the cause of diseases classified elsewhere: Secondary | ICD-10-CM

## 2017-01-11 ENCOUNTER — Telehealth: Payer: Self-pay | Admitting: Obstetrics and Gynecology

## 2017-01-11 NOTE — Telephone Encounter (Signed)
Scheduled Colpo with patient

## 2017-01-11 NOTE — Telephone Encounter (Signed)
EPIC down at check out - LVM for patient to call to schedule - no disposition from MD

## 2017-01-11 NOTE — Progress Notes (Signed)
HPI:      [name] is a 46 y.o. Mariah Davis who LMP was No LMP recorded. Patient has had a hysterectomy.  Subjective:   She presents today After having an abnormal Pap smear revealing vaginal cuff ASCUS with positive HPV. The patient states that she recently had cervical cancer and that's why she had a hysterectomy. 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 This is her first vaginal cuff Pap since her surgery several years ago.    Hx: The following portions of the patient's history were reviewed and updated as appropriate:             She  has a past medical history of Cancer (HCC) and Thyroid disease. She  does not have a problem list on file. She  has a past surgical history that includes Cesarean section; Dilation and curettage of uterus; Gastric bypass; Abdominal hysterectomy; and Cholecystectomy. Her family history includes Stroke in her mother. She  reports that she has never smoked. She has never used smokeless tobacco. She reports that she does not drink alcohol or use drugs. She is allergic to codeine and contrast media [iodinated diagnostic agents].       Review of Systems:  Review of Systems  Constitutional: Denied constitutional symptoms, night sweats, recent illness, fatigue, fever, insomnia and weight loss.  Eyes: Denied eye symptoms, eye pain, photophobia, vision change and visual disturbance.  Ears/Nose/Throat/Neck: Denied ear, nose, throat or neck symptoms, hearing loss, nasal discharge, sinus congestion and sore throat.  Cardiovascular: Denied cardiovascular symptoms, arrhythmia, chest pain/pressure, edema, exercise intolerance, orthopnea and palpitations.  Respiratory: Denied pulmonary symptoms, asthma, pleuritic pain, productive sputum, cough, dyspnea and wheezing.  Gastrointestinal: Denied, gastro-esophageal reflux, melena, nausea and vomiting.  Genitourinary: Denied genitourinary symptoms including symptomatic vaginal discharge,  pelvic relaxation issues, and urinary complaints.  Musculoskeletal: Denied musculoskeletal symptoms, stiffness, swelling, muscle weakness and myalgia.  Dermatologic: Denied dermatology symptoms, rash and scar.  Neurologic: Denied neurology symptoms, dizziness, headache, neck pain and syncope.  Psychiatric: Denied psychiatric symptoms, anxiety and depression.  Endocrine: Denied endocrine symptoms including hot flashes and night sweats.   Meds:   Current Outpatient Prescriptions on File Prior to Visit  Medication Sig Dispense Refill  . ergocalciferol (VITAMIN D2) 50000 units capsule Take 50,000 Units by mouth once a week.    Marland Kitchen. ibuprofen (ADVIL,MOTRIN) 800 MG tablet Take 1 tablet (800 mg total) by mouth every 8 (eight) hours as needed. 30 tablet 0  . levothyroxine (SYNTHROID, LEVOTHROID) 88 MCG tablet Take 100 mcg by mouth daily.   0  . omeprazole (PRILOSEC) 20 MG capsule Take 1 capsule by mouth 2 (two) times daily.  0  . benzonatate (TESSALON PERLES) 100 MG capsule 1-2 every 8 hours prn cough (Patient not taking: Reported on 12/22/2016) 30 capsule 0   No current facility-administered medications on file prior to visit.     Objective:     Vitals:   01/10/17 1529  BP: 100/67  Pulse: 69              Pap smear reviewed directly with the patient  Assessment:    G3P2012 There are no active problems to display for this patient.    1. Atypical squamous cells of undetermined significance on cytologic smear of cervix (ASC-US)   2. High risk human papilloma virus (HPV) infection of cervix     Abnormal Pap of the vaginal cuff. History of CIS   Plan:  1.  We have discussed multiple options in detail. I have offered her a referral to GYN oncology for follow-up if she so desires. She has declined this. We have elected to perform a vaginal cuff colposcopy with biopsies as needed.  HPV and Pap smears as well as abnormal cytology were discussed in detail.  Orders No orders of  the defined types were placed in this encounter.   No orders of the defined types were placed in this encounter.       F/U  Return in about 1 week (around 01/17/2017). I spent 17 minutes with this patient of which greater than 50% was spent discussing abnormal Pap smears, abnormal cervical cytology, HPV, follow-up options.  Elonda Husky, M.D. 01/11/2017 12:19 PM

## 2017-01-17 ENCOUNTER — Encounter: Payer: BLUE CROSS/BLUE SHIELD | Admitting: Obstetrics and Gynecology

## 2017-01-25 ENCOUNTER — Ambulatory Visit (INDEPENDENT_AMBULATORY_CARE_PROVIDER_SITE_OTHER): Payer: BLUE CROSS/BLUE SHIELD | Admitting: Obstetrics and Gynecology

## 2017-01-25 ENCOUNTER — Encounter: Payer: Self-pay | Admitting: Obstetrics and Gynecology

## 2017-01-25 VITALS — BP 104/71 | HR 76 | Wt 207.1 lb

## 2017-01-25 DIAGNOSIS — R87620 Atypical squamous cells of undetermined significance on cytologic smear of vagina (ASC-US): Secondary | ICD-10-CM

## 2017-01-25 DIAGNOSIS — R87811 Vaginal high risk human papillomavirus (HPV) DNA test positive: Secondary | ICD-10-CM | POA: Diagnosis not present

## 2017-01-25 NOTE — Progress Notes (Signed)
     HPI:  Mariah Davis is a 46 y.o.  [id number]  who presents today for evaluation and management of abnormal vaginal cytology.    Dysplasia History:  H/O CIN III of cervix and hysterctomy ROS:  Pertinent items are noted in HPI.  OB History  Gravida Para Term Preterm AB Living  3 2 2   1 2   SAB TAB Ectopic Multiple Live Births  1       2    # Outcome Date GA Lbr Len/2nd Weight Sex Delivery Anes PTL Lv  3 Term 381994    M CS-Unspec   LIV  2 Term 431990    M CS-Unspec  N LIV  1 SAB 1988 [id number]             Past Medical History:  Diagnosis Date  . Cancer (HCC)    cervical cancer  . Thyroid disease     Past Surgical History:  Procedure Laterality Date  . ABDOMINAL HYSTERECTOMY    . CESAREAN SECTION    . CHOLECYSTECTOMY    . DILATION AND CURETTAGE OF UTERUS    . GASTRIC BYPASS      SOCIAL HISTORY: History  Alcohol Use No   History  Drug Use No     Family History  Problem Relation Age of Onset  . Stroke Mother     ALLERGIES:  Codeine and Contrast media [iodinated diagnostic agents]  She has a current medication list which includes the following prescription(s): levothyroxine, omeprazole, benzonatate, ergocalciferol, and ibuprofen.  Physical Exam: -Vitals:  BP 104/71   Pulse 76   Wt 207 lb 2 oz (94 kg)   LMP  (Exact Date)   BMI 40.45 kg/m  GEN: WD, WN, NAD.  A+ O x 3, good mood and affect. ABD:  NT, ND.  Soft, no masses.  No hernias noted.  PROCEDURE: 1.  Urine Pregnancy Test:  not done 2.  Colposcopy performed with 4% acetic acid after verbal consent obtained                           -Aceto-white Lesions Location(s): much of upper vagina              -Biopsy performed at upper vagina 3 o'clock - ? Mosaic-type changes.              -ECC indicated and performed: No.     -Biopsy sites made hemostatic with pressure and Monsel's solution   -Satisfactory colposcopy: Yes.      -Evidence of Invasive cervical CA :  NO  ASSESSMENT:  Mariah RichardsMichelle L Davis is a  46 y.o. [id number] here for  1. ASCUS with positive high risk human papillomavirus of vagina   .  PLAN: 1.  I discussed the grading system of pap smears and HPV high risk viral types.  We will discuss management after colpo results return.  No orders of the defined types were placed in this encounter.         F/U  No Follow-up on file.  Brennan Baileyavid Yaritzy Huser ,MD 01/25/2017,2:46 PM

## 2017-01-25 NOTE — Addendum Note (Signed)
Addended by: Brooke DareSICK, Jacquelinne Speak L on: 01/25/2017 03:06 PM   Modules accepted: Orders

## 2017-01-29 LAB — PATHOLOGY

## 2017-02-01 ENCOUNTER — Ambulatory Visit (INDEPENDENT_AMBULATORY_CARE_PROVIDER_SITE_OTHER): Payer: BLUE CROSS/BLUE SHIELD | Admitting: Obstetrics and Gynecology

## 2017-02-01 ENCOUNTER — Encounter: Payer: Self-pay | Admitting: Obstetrics and Gynecology

## 2017-02-01 VITALS — BP 98/65 | HR 66 | Ht 60.0 in | Wt 208.2 lb

## 2017-02-01 DIAGNOSIS — R87811 Vaginal high risk human papillomavirus (HPV) DNA test positive: Secondary | ICD-10-CM

## 2017-02-01 DIAGNOSIS — R87620 Atypical squamous cells of undetermined significance on cytologic smear of vagina (ASC-US): Secondary | ICD-10-CM | POA: Diagnosis not present

## 2017-02-01 NOTE — Progress Notes (Signed)
HPI:      Mariah Davis is a 46 y.o. [id number] who LMP was No LMP recorded. Patient has had a hysterectomy.  Subjective:   She presents today For follow-up of her vaginal colposcopy. She has no complaints today.    Hx: The following portions of the patient's history were reviewed and updated as appropriate:             She  has a past medical history of Cancer (HCC) and Thyroid disease. She  does not have a problem list on file. She  has a past surgical history that includes Cesarean section; Dilation and curettage of uterus; Gastric bypass; Abdominal hysterectomy; Cholecystectomy; and Colposcopy. Her family history includes Stroke in her mother. She  reports that she has never smoked. She has never used smokeless tobacco. She reports that she does not drink alcohol or use drugs. She is allergic to codeine and contrast media [iodinated diagnostic agents].       Review of Systems:  Review of Systems  Constitutional: Denied constitutional symptoms, night sweats, recent illness, fatigue, fever, insomnia and weight loss.  Eyes: Denied eye symptoms, eye pain, photophobia, vision change and visual disturbance.  Ears/Nose/Throat/Neck: Denied ear, nose, throat or neck symptoms, hearing loss, nasal discharge, sinus congestion and sore throat.  Cardiovascular: Denied cardiovascular symptoms, arrhythmia, chest pain/pressure, edema, exercise intolerance, orthopnea and palpitations.  Respiratory: Denied pulmonary symptoms, asthma, pleuritic pain, productive sputum, cough, dyspnea and wheezing.  Gastrointestinal: Denied, gastro-esophageal reflux, melena, nausea and vomiting.  Genitourinary: Denied genitourinary symptoms including symptomatic vaginal discharge, pelvic relaxation issues, and urinary complaints.  Musculoskeletal: Denied musculoskeletal symptoms, stiffness, swelling, muscle weakness and myalgia.  Dermatologic: Denied dermatology symptoms, rash and scar.  Neurologic: Denied neurology  symptoms, dizziness, headache, neck pain and syncope.  Psychiatric: Denied psychiatric symptoms, anxiety and depression.  Endocrine: Denied endocrine symptoms including hot flashes and night sweats.   Meds:   Current Outpatient Prescriptions on File Prior to Visit  Medication Sig Dispense Refill  . ergocalciferol (VITAMIN D2) 50000 units capsule Take 50,000 Units by mouth once a week.    Marland Kitchen. ibuprofen (ADVIL,MOTRIN) 800 MG tablet Take 1 tablet (800 mg total) by mouth every 8 (eight) hours as needed. 30 tablet 0  . levothyroxine (SYNTHROID, LEVOTHROID) 88 MCG tablet Take 100 mcg by mouth daily.   0  . omeprazole (PRILOSEC) 20 MG capsule Take 1 capsule by mouth 2 (two) times daily.  0  . benzonatate (TESSALON PERLES) 100 MG capsule 1-2 every 8 hours prn cough (Patient not taking: Reported on 12/22/2016) 30 capsule 0   No current facility-administered medications on file prior to visit.     Objective:     Vitals:   02/01/17 1112  BP: 98/65  Pulse: 66              Colposcopy results reviewed directly with the patient.  Assessment:    [id number] There are no active problems to display for this patient.    1. ASCUS with positive high risk human papillomavirus of vagina     Some acetowhite change of the vagina but biopsy negative for dysplasia.   Plan:            1.  Recommend routine annual visits with vaginal cuff cytology every 2 years.  Repeat vaginal colposcopy as indicated by future Paps.  Orders No orders of the defined types were placed in this encounter.    Meds ordered this encounter  Medications  . vitamin B-12 (CYANOCOBALAMIN)  1000 MCG tablet    Sig: Take 5,000 mcg by mouth daily.        F/U  Return in about 1 year (around 02/01/2018) for Annual Physical. I spent 19 minutes with this patient of which greater than 50% was spent discussing her colposcopy, her pathology findings, future medical care including timing of Pap smears and possible colposcopies.  Elonda Huskyavid J.  Charnelle Bergeman, M.D. 02/01/2017 12:06 PM

## 2017-07-16 ENCOUNTER — Telehealth: Payer: Self-pay

## 2017-07-16 NOTE — Telephone Encounter (Signed)
Copied from CRM [phone]. Topic: Appointment Scheduling - Scheduling Inquiry for Clinic >> Jul 16, 2017  3:21 PM Windy KalataMichael, Taylor L, NT wrote: Reason for CRM: pt husband is a patient of Dr[facility]in [facility]) and she is wanting to know if Dr. Lorin PicketScott will take her on as a new patient. Please advise and contact patient please

## 2017-07-16 NOTE — Telephone Encounter (Signed)
See me. Thanks.   

## 2017-07-17 NOTE — Telephone Encounter (Signed)
Let pt know that Dr. Lorin PicketScott is not taking on any new pt

## 2018-01-09 ENCOUNTER — Other Ambulatory Visit: Payer: Self-pay | Admitting: Family Medicine

## 2018-01-09 ENCOUNTER — Other Ambulatory Visit: Payer: Self-pay | Admitting: Student

## 2018-01-09 DIAGNOSIS — Z1231 Encounter for screening mammogram for malignant neoplasm of breast: Secondary | ICD-10-CM

## 2018-01-23 ENCOUNTER — Ambulatory Visit
Admission: RE | Admit: 2018-01-23 | Discharge: 2018-01-23 | Disposition: A | Discharge status: Home / Self Care | Payer: BLUE CROSS/BLUE SHIELD | Source: Ambulatory Visit | Attending: Student | Admitting: Student

## 2018-01-23 DIAGNOSIS — Z1231 Encounter for screening mammogram for malignant neoplasm of breast: Secondary | ICD-10-CM | POA: Diagnosis present

## 2018-02-23 ENCOUNTER — Other Ambulatory Visit
Admission: RE | Admit: 2018-02-23 | Discharge: 2018-02-23 | Disposition: A | Discharge status: Home / Self Care | Payer: BLUE CROSS/BLUE SHIELD | Source: Ambulatory Visit | Attending: Nurse Practitioner | Admitting: Nurse Practitioner

## 2018-02-23 DIAGNOSIS — R197 Diarrhea, unspecified: Secondary | ICD-10-CM | POA: Diagnosis not present

## 2018-02-23 LAB — GASTROINTESTINAL PANEL BY PCR, STOOL (REPLACES STOOL CULTURE)

## 2018-02-23 LAB — C DIFFICILE QUICK SCREEN W PCR REFLEX
C Diff antigen: NEGATIVE
C Diff interpretation: NOT DETECTED
C Diff toxin: NEGATIVE

## 2018-02-27 LAB — PANCREATIC ELASTASE, FECAL: Pancreatic Elastase-1, Stool: 484 ug Elast./g (ref >200)

## 2018-03-01 ENCOUNTER — Other Ambulatory Visit: Payer: Self-pay

## 2018-03-01 ENCOUNTER — Inpatient Hospital Stay: Payer: BLUE CROSS/BLUE SHIELD

## 2018-03-01 ENCOUNTER — Encounter: Payer: Self-pay | Admitting: Oncology

## 2018-03-01 ENCOUNTER — Inpatient Hospital Stay: Payer: BLUE CROSS/BLUE SHIELD | Attending: Oncology | Admitting: Oncology

## 2018-03-01 VITALS — BP 113/75 | HR 52 | Temp 97.8°F | Resp 18 | Ht 60.25 in | Wt 214.0 lb

## 2018-03-01 DIAGNOSIS — Z8541 Personal history of malignant neoplasm of cervix uteri: Secondary | ICD-10-CM | POA: Diagnosis not present

## 2018-03-01 DIAGNOSIS — E538 Deficiency of other specified B group vitamins: Secondary | ICD-10-CM | POA: Insufficient documentation

## 2018-03-01 DIAGNOSIS — E079 Disorder of thyroid, unspecified: Secondary | ICD-10-CM | POA: Diagnosis not present

## 2018-03-01 DIAGNOSIS — Z9884 Bariatric surgery status: Secondary | ICD-10-CM

## 2018-03-01 DIAGNOSIS — Z8711 Personal history of peptic ulcer disease: Secondary | ICD-10-CM | POA: Diagnosis not present

## 2018-03-01 DIAGNOSIS — D508 Other iron deficiency anemias: Secondary | ICD-10-CM

## 2018-03-01 DIAGNOSIS — Z79899 Other long term (current) drug therapy: Secondary | ICD-10-CM | POA: Diagnosis not present

## 2018-03-01 DIAGNOSIS — K580 Irritable bowel syndrome with diarrhea: Secondary | ICD-10-CM | POA: Insufficient documentation

## 2018-03-01 DIAGNOSIS — D509 Iron deficiency anemia, unspecified: Secondary | ICD-10-CM

## 2018-03-01 NOTE — Progress Notes (Signed)
Patient here today as a new patient with anemia  

## 2018-03-01 NOTE — Progress Notes (Signed)
Hematology/Oncology Consult note [facility] [phone] [phone]   Patient Care Team: Carren Rang, Cordelia Poche as PCP - General (Physician Assistant)  REFERRING PROVIDER: Amedeo Kinsman [facility] Gastroenterology CHIEF COMPLAINTS/REASON FOR VISIT:  Evaluation of Iron deficiency anemia.   HISTORY OF PRESENTING ILLNESS:  Mariah Davis is a  47 y.o.  female with PMH listed below who was referred to me for evaluation of iron deficiency anemia. Patient recently was seen by primary care physician Dr. Peyton Najjar on 01/08/2018 for annual physical.  Patient has a history of gastric bypass in 2013 and history of peptic ulcer disease.  Patient also had a history of chronic IBS-diarrhea. She was sent to gastroenterology for further evaluation.  Patient was seen by [facility] clinic gastroenterology Dr. Mechele Collin on 02/19/2018 and had lab work-up done.  History of iron deficiency: Yes Rectal bleeding: Denies Menstrual bleeding/ Vaginal bleeding :  Hematemesis or hemoptysis : denies Blood in urine : denies  Last endoscopy: Fatigue: Yes.  SOB: deneis History of gastric bypass.  Data reviewed, Barium study on 2017 impression sliding hiatal hernia with prominent gastroesophageal reflux no obstructing esophageal lesion.  Postsurgical changes of the stomach. Previous colonoscopy on 12/30/2007 for iron deficiency anemia showed internal nonbleeding small hemorrhoids, otherwise normal. Upper endoscopy on 12/30/2007 for iron deficiency showed a normal esophagus, gastritis, duodenitis, pathology returned to duodenal mucosa with intact villous no inflammation or ulceration.  Stomach showed an antral mucosa with moderate hyperpathia, no active inflammatory trophy for intestinal metaplastic.  Negative H. pylori.    Reviewed labs that were done at [facility] clinic CBC on 02/19/2018 showed mild leukocytosis with WBC 12.3, hemoglobin 11.9, MCV 87.4, platelet counts  231,000 Differential showed increased absolute lymphocytes at 3.79, other differential normal. Iron panel on 02/19/2018 showed iron 24, ferritin 8, iron saturation 7, TIBC 353.9. B12 level decreased at 207 01/02/2018 TSH 22.7, free T4 0.5   CMP unremarkable Review of Systems  Constitutional: Positive for malaise/fatigue. Negative for chills, fever and weight loss.  HENT: Negative for nosebleeds and sore throat.   Eyes: Negative for double vision, photophobia and redness.  Respiratory: Negative for cough, shortness of breath and wheezing.   Cardiovascular: Negative for chest pain, palpitations and orthopnea.  Gastrointestinal: Negative for abdominal pain, blood in stool, nausea and vomiting.  Genitourinary: Negative for dysuria.  Musculoskeletal: Negative for back pain, myalgias and neck pain.  Skin: Negative for itching and rash.  Neurological: Negative for dizziness, tingling and tremors.  Endo/Heme/Allergies: Negative for environmental allergies. Does not bruise/bleed easily.  Psychiatric/Behavioral: Negative for depression.    MEDICAL HISTORY:  Past Medical History:  Diagnosis Date  . Cancer (HCC)    cervical cancer  . Thyroid disease     SURGICAL HISTORY: Past Surgical History:  Procedure Laterality Date  . ABDOMINAL HYSTERECTOMY    . CESAREAN SECTION    . CHOLECYSTECTOMY    . COLPOSCOPY    . DILATION AND CURETTAGE OF UTERUS    . GASTRIC BYPASS      SOCIAL HISTORY: Social History   Socioeconomic History  . Marital status: Married    Spouse name: Not on file  . Number of children: Not on file  . Years of education: Not on file  . Highest education level: Not on file  Occupational History  . Not on file  Social Needs  . Financial resource strain: Not on file  . Food insecurity:    Worry: Not on file    Inability: Not on file  .  Transportation needs:    Medical: Not on file    Non-medical: Not on file  Tobacco Use  . Smoking status: Never Smoker  .  Smokeless tobacco: Never Used  Substance and Sexual Activity  . Alcohol use: No  . Drug use: No  . Sexual activity: Yes    Birth control/protection: None  Lifestyle  . Physical activity:    Days per week: Not on file    Minutes per session: Not on file  . Stress: Not on file  Relationships  . Social connections:    Talks on phone: Not on file    Gets together: Not on file    Attends religious service: Not on file    Active member of club or organization: Not on file    Attends meetings of clubs or organizations: Not on file    Relationship status: Not on file  . Intimate partner violence:    Fear of current or ex partner: Not on file    Emotionally abused: Not on file    Physically abused: Not on file    Forced sexual activity: Not on file  Other Topics Concern  . Not on file  Social History Narrative  . Not on file    FAMILY HISTORY: Family History  Problem Relation Age of Onset  . Stroke Mother   . Breast cancer Neg Hx     ALLERGIES:  is allergic to codeine and contrast media [iodinated diagnostic agents].  MEDICATIONS:  Current Outpatient Medications  Medication Sig Dispense Refill  . benzonatate (TESSALON PERLES) 100 MG capsule 1-2 every 8 hours prn cough (Patient not taking: Reported on 12/22/2016) 30 capsule 0  . ergocalciferol (VITAMIN D2) 50000 units capsule Take 50,000 Units by mouth once a week.    Marland Kitchen. ibuprofen (ADVIL,MOTRIN) 800 MG tablet Take 1 tablet (800 mg total) by mouth every 8 (eight) hours as needed. 30 tablet 0  . levothyroxine (SYNTHROID, LEVOTHROID) 88 MCG tablet Take 100 mcg by mouth daily.   0  . omeprazole (PRILOSEC) 20 MG capsule Take 1 capsule by mouth 2 (two) times daily.  0  . vitamin B-12 (CYANOCOBALAMIN) 1000 MCG tablet Take 5,000 mcg by mouth daily.     No current facility-administered medications for this visit.      PHYSICAL EXAMINATION: ECOG PERFORMANCE STATUS: 0 - Asymptomatic Vitals:   03/01/18 1521  BP: 113/75  Pulse: (!)  52  Resp: 18  Temp: 97.8 F (36.6 C)   Filed Weights   03/01/18 1521  Weight: 214 lb (97.1 kg)    Physical Exam  Constitutional: She is oriented to person, place, and time. She appears well-developed and well-nourished. No distress.  HENT:  Head: Normocephalic and atraumatic.  Right Ear: External ear normal.  Left Ear: External ear normal.  Mouth/Throat: Oropharynx is clear and moist.  Eyes: Pupils are equal, round, and reactive to light. EOM are normal. No scleral icterus.  Neck: Normal range of motion. Neck supple.  Cardiovascular: Normal rate, regular rhythm and normal heart sounds.  Pulmonary/Chest: Effort normal. No respiratory distress. She has no wheezes.  Abdominal: Soft. Bowel sounds are normal. She exhibits no distension and no mass. There is no tenderness.  Musculoskeletal: Normal range of motion. She exhibits no edema or deformity.  Neurological: She is alert and oriented to person, place, and time. No cranial nerve deficit. Coordination normal.  Skin: Skin is warm and dry. No rash noted. No erythema.  Psychiatric: She has a normal mood and affect. Her  behavior is normal. Thought content normal.     LABORATORY DATA:  I have reviewed the data as listed Lab Results  Component Value Date   WBC 14.1 (H) 07/22/2015   HGB 12.5 07/22/2015   HCT 38.1 07/22/2015   MCV 88.5 07/22/2015   PLT 218 07/22/2015   No results for input(s): NA, K, CL, CO2, GLUCOSE, BUN, CREATININE, CALCIUM, GFRNONAA, GFRAA, PROT, ALBUMIN, AST, ALT, ALKPHOS, BILITOT, BILIDIR, IBILI in the last 8760 hours. Iron/TIBC/Ferritin/ %Sat No results found for: IRON, TIBC, FERRITIN, IRONPCTSAT      ASSESSMENT & PLAN:  1. Other iron deficiency anemia   2. H/O gastric bypass   3. Vitamin B 12 deficiency    #Labs are reviewed and discussed with patient she has iron deficiency anemia.  Plan IV iron with Venofer 200mg  weekly x 4 doses. Allergy reactions/infusion reaction including anaphylactic reaction  discussed with patient. Other side effects include but not limited to high blood pressure, skin rash, weight gain, leg swelling, etc. Patient voices understanding and willing to proceed.  # B12 deficiency, she declines parental B12 injection as she feels B12 injection causes headache.  prescribed her oral B12 supplement Rx.    Orders Placed This Encounter  Procedures  . Folate    Standing Status:   Future    Standing Expiration Date:   03/02/2019  . CBC with Differential/Platelet    Standing Status:   Future    Standing Expiration Date:   03/02/2019  . Iron and TIBC    Standing Status:   Future    Standing Expiration Date:   03/02/2019  . Ferritin    Standing Status:   Future    Standing Expiration Date:   03/02/2019    All questions were answered. The patient knows to call the clinic with any problems questions or concerns.  Return of visit: 2 months.  Thank you for this kind referral and the opportunity to participate in the care of this patient. A copy of today's note is routed to referring provider  Total face to face encounter time for this patient visit was 45 min. >50% of the time was  spent in counseling and coordination of care.    Rickard Patience, MD, PhD Hematology Oncology [facility] Pager- [phone] 03/01/2018

## 2018-03-02 MED ORDER — VITAMIN B-12 1000 MCG PO TABS: 2000.0000 ug | ORAL_TABLET | Freq: Every day | ORAL | 3 refills | Status: AC

## 2018-03-07 ENCOUNTER — Inpatient Hospital Stay: Payer: BLUE CROSS/BLUE SHIELD | Attending: Oncology

## 2018-03-07 ENCOUNTER — Other Ambulatory Visit: Payer: Self-pay | Admitting: Oncology

## 2018-03-07 DIAGNOSIS — D509 Iron deficiency anemia, unspecified: Secondary | ICD-10-CM | POA: Insufficient documentation

## 2018-03-07 MED ORDER — IRON SUCROSE 20 MG/ML IV SOLN
200.0000 mg | Freq: Once | INTRAVENOUS | Status: AC
  Administered 2018-03-07: 200 mg via INTRAVENOUS
  Filled 2018-03-07: qty 10

## 2018-03-07 MED ORDER — SODIUM CHLORIDE 0.9 % IV SOLN: 200.0000 mg | Freq: Once | INTRAVENOUS | Status: DC

## 2018-03-07 MED ORDER — SODIUM CHLORIDE 0.9 % IV SOLN
Freq: Once | INTRAVENOUS | Status: AC
  Administered 2018-03-07: 12:00:00 via INTRAVENOUS
  Filled 2018-03-07: qty 250

## 2018-03-15 ENCOUNTER — Inpatient Hospital Stay: Payer: BLUE CROSS/BLUE SHIELD

## 2018-03-15 VITALS — BP 102/68 | HR 52 | Temp 98.3°F | Resp 18

## 2018-03-15 DIAGNOSIS — D509 Iron deficiency anemia, unspecified: Secondary | ICD-10-CM | POA: Diagnosis not present

## 2018-03-15 MED ORDER — IRON SUCROSE 20 MG/ML IV SOLN
200.0000 mg | Freq: Once | INTRAVENOUS | Status: AC
  Administered 2018-03-15: 200 mg via INTRAVENOUS
  Filled 2018-03-15: qty 10

## 2018-03-15 MED ORDER — SODIUM CHLORIDE 0.9 % IV SOLN: 200.0000 mg | Freq: Once | INTRAVENOUS | Status: DC

## 2018-03-15 MED ORDER — SODIUM CHLORIDE 0.9 % IV SOLN
Freq: Once | INTRAVENOUS | Status: AC
  Administered 2018-03-15: 14:00:00 via INTRAVENOUS
  Filled 2018-03-15: qty 250

## 2018-03-20 ENCOUNTER — Telehealth: Payer: Self-pay | Admitting: *Deleted

## 2018-03-20 NOTE — Telephone Encounter (Addendum)
Call from Judithann GravesK Mills, NP [facility] asking if there is any concern regarding the elevated WBC count on this patient. Please return her call [phone]  Component Name  WBC 02/19/2018 10/04/2017 02/28/2016 11/30/2015 10/04/2015 01/12/2015 04/29/2014    12.3 (H) 11.4 (H) 13.1 (H) 12.2 (H) 13.2 (H) 11.1 (H) 8.9

## 2018-03-20 NOTE — Telephone Encounter (Signed)
Please give them a call and let Mariah BradfordKimberly know that I noticed the mild leukocytosis, predominantly lymphocytosis. Looking at her trend, she has had chronic leukocytosis, neutrophilia or lymphocytosis.  I will repeat cbc when she comes back to see me after iron store repleted.

## 2018-03-22 ENCOUNTER — Inpatient Hospital Stay: Payer: BLUE CROSS/BLUE SHIELD

## 2018-03-22 VITALS — BP 106/64 | HR 57 | Temp 98.5°F | Resp 20

## 2018-03-22 DIAGNOSIS — D509 Iron deficiency anemia, unspecified: Secondary | ICD-10-CM | POA: Diagnosis not present

## 2018-03-22 MED ORDER — SODIUM CHLORIDE 0.9 % IV SOLN: 200.0000 mg | Freq: Once | INTRAVENOUS | Status: DC

## 2018-03-22 MED ORDER — SODIUM CHLORIDE 0.9 % IV SOLN
Freq: Once | INTRAVENOUS | Status: AC
  Administered 2018-03-22: 14:00:00 via INTRAVENOUS
  Filled 2018-03-22: qty 250

## 2018-03-22 MED ORDER — IRON SUCROSE 20 MG/ML IV SOLN
200.0000 mg | Freq: Once | INTRAVENOUS | Status: AC
  Administered 2018-03-22: 200 mg via INTRAVENOUS
  Filled 2018-03-22: qty 10

## 2018-03-25 NOTE — Telephone Encounter (Signed)
Cal returned to Servando SalinaK Mill, NP and she was given response from Dr Cathie HoopsYu and she thanked me for returning the call

## 2018-03-29 ENCOUNTER — Inpatient Hospital Stay: Payer: BLUE CROSS/BLUE SHIELD

## 2018-03-29 VITALS — BP 106/74 | HR 60 | Temp 98.7°F | Resp 20

## 2018-03-29 DIAGNOSIS — D509 Iron deficiency anemia, unspecified: Secondary | ICD-10-CM | POA: Diagnosis not present

## 2018-03-29 MED ORDER — SODIUM CHLORIDE 0.9 % IV SOLN: 200.0000 mg | Freq: Once | INTRAVENOUS | Status: DC

## 2018-03-29 MED ORDER — IRON SUCROSE 20 MG/ML IV SOLN
200.0000 mg | Freq: Once | INTRAVENOUS | Status: AC
  Administered 2018-03-29: 200 mg via INTRAVENOUS
  Filled 2018-03-29: qty 10

## 2018-03-29 MED ORDER — SODIUM CHLORIDE 0.9 % IV SOLN
Freq: Once | INTRAVENOUS | Status: AC
  Administered 2018-03-29: 15:00:00 via INTRAVENOUS
  Filled 2018-03-29: qty 250

## 2018-04-29 ENCOUNTER — Inpatient Hospital Stay: Payer: BLUE CROSS/BLUE SHIELD | Attending: Oncology

## 2018-05-01 ENCOUNTER — Inpatient Hospital Stay: Payer: BLUE CROSS/BLUE SHIELD

## 2018-05-01 ENCOUNTER — Inpatient Hospital Stay: Payer: BLUE CROSS/BLUE SHIELD | Admitting: Oncology

## 2018-05-17 ENCOUNTER — Encounter: Payer: Self-pay | Admitting: *Deleted

## 2018-05-20 ENCOUNTER — Encounter: Payer: Self-pay | Admitting: *Deleted

## 2018-05-20 ENCOUNTER — Ambulatory Visit: Payer: BLUE CROSS/BLUE SHIELD | Admitting: Anesthesiology

## 2018-05-20 ENCOUNTER — Encounter
Admission: RE | Disposition: A | Discharge status: Home / Self Care | Payer: Self-pay | Source: Ambulatory Visit | Attending: Unknown Physician Specialty

## 2018-05-20 ENCOUNTER — Ambulatory Visit
Admission: RE | Admit: 2018-05-20 | Discharge: 2018-05-20 | Disposition: A | Discharge status: Home / Self Care | Payer: BLUE CROSS/BLUE SHIELD | Source: Ambulatory Visit | Attending: Unknown Physician Specialty | Admitting: Unknown Physician Specialty

## 2018-05-20 DIAGNOSIS — Z9884 Bariatric surgery status: Secondary | ICD-10-CM | POA: Insufficient documentation

## 2018-05-20 DIAGNOSIS — F329 Major depressive disorder, single episode, unspecified: Secondary | ICD-10-CM | POA: Insufficient documentation

## 2018-05-20 DIAGNOSIS — E538 Deficiency of other specified B group vitamins: Secondary | ICD-10-CM | POA: Diagnosis not present

## 2018-05-20 DIAGNOSIS — K580 Irritable bowel syndrome with diarrhea: Secondary | ICD-10-CM | POA: Diagnosis not present

## 2018-05-20 DIAGNOSIS — R1084 Generalized abdominal pain: Secondary | ICD-10-CM | POA: Insufficient documentation

## 2018-05-20 DIAGNOSIS — K640 First degree hemorrhoids: Secondary | ICD-10-CM | POA: Diagnosis not present

## 2018-05-20 DIAGNOSIS — F419 Anxiety disorder, unspecified: Secondary | ICD-10-CM | POA: Insufficient documentation

## 2018-05-20 DIAGNOSIS — Z79890 Hormone replacement therapy (postmenopausal): Secondary | ICD-10-CM | POA: Diagnosis not present

## 2018-05-20 DIAGNOSIS — Z791 Long term (current) use of non-steroidal anti-inflammatories (NSAID): Secondary | ICD-10-CM | POA: Insufficient documentation

## 2018-05-20 DIAGNOSIS — E039 Hypothyroidism, unspecified: Secondary | ICD-10-CM | POA: Insufficient documentation

## 2018-05-20 DIAGNOSIS — K219 Gastro-esophageal reflux disease without esophagitis: Secondary | ICD-10-CM | POA: Diagnosis not present

## 2018-05-20 DIAGNOSIS — Z79899 Other long term (current) drug therapy: Secondary | ICD-10-CM | POA: Diagnosis not present

## 2018-05-20 HISTORY — DX: Deficiency of other specified B group vitamins: E53.8

## 2018-05-20 HISTORY — DX: Headache, unspecified: R51.9

## 2018-05-20 HISTORY — PX: COLONOSCOPY WITH PROPOFOL: SHX5780

## 2018-05-20 HISTORY — DX: Sleep disorder, unspecified: G47.9

## 2018-05-20 HISTORY — DX: Anemia, unspecified: D64.9

## 2018-05-20 HISTORY — DX: Irritable bowel syndrome without diarrhea: K58.9

## 2018-05-20 HISTORY — DX: Headache: R51

## 2018-05-20 HISTORY — PX: ESOPHAGOGASTRODUODENOSCOPY (EGD) WITH PROPOFOL: SHX5813

## 2018-05-20 HISTORY — DX: Hypothyroidism, unspecified: E03.9

## 2018-05-20 HISTORY — DX: Diarrhea, unspecified: R19.7

## 2018-05-20 SURGERY — ESOPHAGOGASTRODUODENOSCOPY (EGD) WITH PROPOFOL
Anesthesia: General

## 2018-05-20 MED ORDER — PROPOFOL 10 MG/ML IV BOLUS
INTRAVENOUS | Status: DC | PRN
  Administered 2018-05-20: 30 mg via INTRAVENOUS
  Administered 2018-05-20: 20 mg via INTRAVENOUS

## 2018-05-20 MED ORDER — LIDOCAINE HCL (PF) 2 % IJ SOLN
INTRAMUSCULAR | Status: AC
  Filled 2018-05-20: qty 10

## 2018-05-20 MED ORDER — MIDAZOLAM HCL 5 MG/5ML IJ SOLN
INTRAMUSCULAR | Status: DC | PRN
  Administered 2018-05-20: 2 mg via INTRAVENOUS

## 2018-05-20 MED ORDER — PROPOFOL 500 MG/50ML IV EMUL
INTRAVENOUS | Status: DC | PRN
  Administered 2018-05-20: 50 ug/kg/min via INTRAVENOUS

## 2018-05-20 MED ORDER — SODIUM CHLORIDE 0.9 % IV SOLN
INTRAVENOUS | Status: DC
  Administered 2018-05-20: 13:00:00 via INTRAVENOUS

## 2018-05-20 MED ORDER — SODIUM CHLORIDE 0.9 % IV SOLN: INTRAVENOUS | Status: DC

## 2018-05-20 MED ORDER — LIDOCAINE HCL (PF) 2 % IJ SOLN
INTRAMUSCULAR | Status: DC | PRN
  Administered 2018-05-20: 100 mg

## 2018-05-20 MED ORDER — FENTANYL CITRATE (PF) 100 MCG/2ML IJ SOLN
INTRAMUSCULAR | Status: DC | PRN
  Administered 2018-05-20: 25 ug via INTRAVENOUS
  Administered 2018-05-20: 50 ug via INTRAVENOUS
  Administered 2018-05-20: 25 ug via INTRAVENOUS

## 2018-05-20 MED ORDER — GLYCOPYRROLATE 0.2 MG/ML IJ SOLN
INTRAMUSCULAR | Status: DC | PRN
  Administered 2018-05-20: 0.2 mg via INTRAVENOUS

## 2018-05-20 MED ORDER — FENTANYL CITRATE (PF) 100 MCG/2ML IJ SOLN
INTRAMUSCULAR | Status: AC
  Filled 2018-05-20: qty 2

## 2018-05-20 MED ORDER — MIDAZOLAM HCL 2 MG/2ML IJ SOLN
INTRAMUSCULAR | Status: AC
  Filled 2018-05-20: qty 2

## 2018-05-20 NOTE — Op Note (Signed)
[facility] Gastroenterology Patient Name: Mariah Davis Procedure Date: 05/20/2018 1:36 PM MRN: [id number] Account #: [id number] Date of Birth: 01-20-1971 Admit Type: Outpatient Age: 47 Room: [facility] ENDO ROOM 1 Gender: Female Note Status: Finalized Procedure:            Colonoscopy Indications:          Generalized abdominal pain, Clinically significant                        diarrhea of unexplained origin Providers:            Scot Jun, MD Referring MD:         No Local Md, MD (Referring MD) Medicines:            Propofol per Anesthesia Complications:        No immediate complications. Procedure:            Pre-Anesthesia Assessment:                       - After reviewing the risks and benefits, the patient                        was deemed in satisfactory condition to undergo the                        procedure.                       After obtaining informed consent, the colonoscope was                        passed under direct vision. Throughout the procedure,                        the patient's blood pressure, pulse, and oxygen                        saturations were monitored continuously. The                        Colonoscope was introduced through the anus and                        advanced to the the cecum, identified by appendiceal                        orifice and ileocecal valve. The colonoscopy was                        performed without difficulty. The patient tolerated the                        procedure well. The quality of the bowel preparation                        was excellent. Findings:      Internal hemorrhoids were found during endoscopy. The hemorrhoids were       small and Grade I (internal hemorrhoids that do not prolapse).      The exam was otherwise without abnormality. Colon appeared normal       through out the colon.  Impression:           - Internal hemorrhoids.                       - The examination was  otherwise normal.                       - No specimens collected. Recommendation:       - The findings and recommendations were discussed with                        the patient's family. Take Imodium as needed for                        diarrhea. Scot Junobert T Clyda Smyth, MD 05/20/2018 2:09:39 PM This report has been signed electronically. Number of Addenda: 0 Note Initiated On: 05/20/2018 1:36 PM Scope Withdrawal Time: 0 hours 6 minutes 16 seconds  Total Procedure Duration: 0 hours 12 minutes 0 seconds       [facility]

## 2018-05-20 NOTE — H&P (Signed)
Primary Care Physician:  Carren Rang, PA-C Primary Gastroenterologist:  Dr. Mechele Collin  Pre-Procedure History & Physical: HPI:  Mariah Davis is a 47 y.o. female is here for an EGD and colonoscopy for abdominal pain and diarrhea endoscopy and colonoscopy.   Past Medical History:  Diagnosis Date  . Anemia    IDA  . Anxiety   . B12 deficiency   . Depression   . Diarrhea   . GERD (gastroesophageal reflux disease)   . Headache   . Hypothyroidism   . IBS (irritable bowel syndrome)   . Sleep disorder   . Thyroid disease   . Vitamin D deficiency     Past Surgical History:  Procedure Laterality Date  . ABDOMINAL HYSTERECTOMY    . CESAREAN SECTION    . CHOLECYSTECTOMY    . COLPOSCOPY    . DILATION AND CURETTAGE OF UTERUS    . ESOPHAGOGASTRODUODENOSCOPY ENDOSCOPY    . GASTRIC BYPASS    . GASTRIC BYPASS  09/2011    Prior to Admission medications   Medication Sig Start Date End Date Taking? Authorizing Provider  citalopram (CELEXA) 20 MG tablet Take 20 mg by mouth daily. 01/20/18  Yes [provider]  dicyclomine (BENTYL) 20 MG tablet Take 20 mg by mouth every 6 (six) hours as needed. 01/08/18 01/08/19 Yes [provider]  ergocalciferol (VITAMIN D2) 50000 units capsule Take 50,000 Units by mouth once a week.   Yes [provider]  ibuprofen (ADVIL,MOTRIN) 800 MG tablet Take 1 tablet (800 mg total) by mouth every 8 (eight) hours as needed. 07/22/15  Yes Charlynne Pander, MD  levothyroxine (SYNTHROID, LEVOTHROID) 125 MCG tablet Take 125 mcg by mouth daily. 01/08/18 01/08/19 Yes [provider]  loratadine (CLARITIN) 10 MG tablet Take 10 mg by mouth daily.   Yes [provider]  montelukast (SINGULAIR) 10 MG tablet Take 10 mg by mouth daily. 01/08/18 01/08/19 Yes [provider]  omeprazole (PRILOSEC) 20 MG capsule Take 1 capsule by mouth 2 (two) times daily. 07/20/15  Yes [provider]  ondansetron (ZOFRAN) 4 MG tablet  Take 4 mg by mouth every 8 (eight) hours as needed for nausea or vomiting.   Yes [provider]  vitamin B-12 (CYANOCOBALAMIN) 1000 MCG tablet Take 2 tablets (2,000 mcg total) by mouth daily. 03/02/18  Vella Redhead, MD    Allergies as of 04/09/2018 - Review Complete 03/01/2018  Allergen Reaction Noted  . Codeine  07/22/2015  . Contrast media [iodinated diagnostic agents]  07/22/2015    Family History  Problem Relation Age of Onset  . Stroke Mother   . COPD Mother   . Heart disease Father   . HIV Sister   . Lung cancer Paternal Aunt   . Arthritis Paternal Grandmother   . Diabetes Paternal Grandfather   . Breast cancer Neg Hx     Social History   Socioeconomic History  . Marital status: Married    Spouse name: Not on file  . Number of children: 2  . Years of education: Not on file  . Highest education level: Not on file  Occupational History  . Not on file  Social Needs  . Financial resource strain: Not on file  . Food insecurity:    Worry: Not on file    Inability: Not on file  . Transportation needs:    Medical: Not on file    Non-medical: Not on file  Tobacco Use  . Smoking status: Never Smoker  .  Smokeless tobacco: Never Used  Substance and Sexual Activity  . Alcohol use: No  . Drug use: No  . Sexual activity: Yes    Birth control/protection: None  Lifestyle  . Physical activity:    Days per week: Not on file    Minutes per session: Not on file  . Stress: Not on file  Relationships  . Social connections:    Talks on phone: Not on file    Gets together: Not on file    Attends religious service: Not on file    Active member of club or organization: Not on file    Attends meetings of clubs or organizations: Not on file    Relationship status: Not on file  . Intimate partner violence:    Fear of current or ex partner: Not on file    Emotionally abused: Not on file    Physically abused: Not on file    Forced sexual activity: Not on file  Other  Topics Concern  . Not on file  Social History Narrative  . Not on file    Review of Systems: See HPI, otherwise negative ROS  Physical Exam: BP 127/88   Pulse (!) 55   Temp 99.1 F (37.3 C) (Tympanic)   Resp 18   Ht 5' (1.524 m)   Wt 93.9 kg   SpO2 100%   BMI 40.43 kg/m  General:   Alert,  pleasant and cooperative in NAD Head:  Normocephalic and atraumatic. Neck:  Supple; no masses or thyromegaly. Lungs:  Clear throughout to auscultation.    Heart:  Regular rate and rhythm. Abdomen:  Soft, nontender and nondistended. Normal bowel sounds, without guarding, and without rebound.   Neurologic:  Alert and  oriented x4;  grossly normal neurologically.  Impression/Plan: Mariah Davis is here for an endoscopy and colonoscopy to be performed for abd pain and diarrhea.  Risks, benefits, limitations, and alternatives regarding  endoscopy and colonoscopy have been reviewed with the patient.  Questions have been answered.  All parties agreeable.   Lynnae PrudeELLIOTT, Bravery Ketcham, MD  05/20/2018, 1:36 PM

## 2018-05-20 NOTE — Transfer of Care (Signed)
Immediate Anesthesia Transfer of Care Note  Patient: Frazier RichardsMichelle L Lakeman  Procedure(s) Performed: ESOPHAGOGASTRODUODENOSCOPY (EGD) WITH PROPOFOL (N/A ) COLONOSCOPY WITH PROPOFOL (N/A )  Patient Location: PACU  Anesthesia Type:General  Level of Consciousness: sedated  Airway & Oxygen Therapy: Patient Spontanous Breathing and Patient connected to nasal cannula oxygen  Post-op Assessment: Report given to RN and Post -op Vital signs reviewed and stable  Post vital signs: Reviewed and stable  Last Vitals:  Vitals Value Taken Time  BP    Temp    Pulse    Resp    SpO2      Last Pain:  Vitals:   05/20/18 1257  TempSrc: Tympanic  PainSc: 8          Complications: No apparent anesthesia complications

## 2018-05-20 NOTE — Anesthesia Preprocedure Evaluation (Signed)
Anesthesia Evaluation  Patient identified by MRN, date of birth, ID band Patient awake    Reviewed: Allergy & Precautions, NPO status , Patient's Chart, lab work & pertinent test results  History of Anesthesia Complications Negative for: history of anesthetic complications  Airway Mallampati: II  TM Distance: >3 FB Neck ROM: Full    Dental no notable dental hx.    Pulmonary neg pulmonary ROS, neg sleep apnea, neg COPD,    breath sounds clear to auscultation- rhonchi (-) wheezing      Cardiovascular Exercise Tolerance: Good (-) hypertension(-) CAD, (-) Past MI, (-) Cardiac Stents and (-) CABG  Rhythm:Regular Rate:Normal - Systolic murmurs and - Diastolic murmurs    Neuro/Psych  Headaches, PSYCHIATRIC DISORDERS Anxiety Depression    GI/Hepatic Neg liver ROS, GERD  ,  Endo/Other  neg diabetesHypothyroidism   Renal/GU negative Renal ROS     Musculoskeletal negative musculoskeletal ROS (+)   Abdominal (+) + obese,   Peds  Hematology  (+) anemia ,   Anesthesia Other Findings Past Medical History: No date: Anemia     Comment:  IDA No date: Anxiety No date: B12 deficiency No date: Depression No date: Diarrhea No date: GERD (gastroesophageal reflux disease) No date: Headache No date: Hypothyroidism No date: IBS (irritable bowel syndrome) No date: Sleep disorder No date: Thyroid disease No date: Vitamin D deficiency   Reproductive/Obstetrics                             Anesthesia Physical Anesthesia Plan  ASA: II  Anesthesia Plan: General   Post-op Pain Management:    Induction: Intravenous  PONV Risk Score and Plan: 2 and Propofol infusion  Airway Management Planned: Natural Airway  Additional Equipment:   Intra-op Plan:   Post-operative Plan:   Informed Consent: I have reviewed the patients History and Physical, chart, labs and discussed the procedure including the risks,  benefits and alternatives for the proposed anesthesia with the patient or authorized representative who has indicated his/her understanding and acceptance.   Dental advisory given  Plan Discussed with: CRNA and Anesthesiologist  Anesthesia Plan Comments:         Anesthesia Quick Evaluation

## 2018-05-20 NOTE — Anesthesia Postprocedure Evaluation (Signed)
Anesthesia Post Note  Patient: Frazier RichardsMichelle L Gambone  Procedure(s) Performed: ESOPHAGOGASTRODUODENOSCOPY (EGD) WITH PROPOFOL (N/A ) COLONOSCOPY WITH PROPOFOL (N/A )  Patient location during evaluation: Endoscopy Anesthesia Type: General Level of consciousness: awake and alert and oriented Pain management: pain level controlled Vital Signs Assessment: post-procedure vital signs reviewed and stable Respiratory status: spontaneous breathing, nonlabored ventilation and respiratory function stable Cardiovascular status: blood pressure returned to baseline and stable Postop Assessment: no signs of nausea or vomiting Anesthetic complications: no     Last Vitals:  Vitals:   05/20/18 1419 05/20/18 1429  BP: 111/70 112/75  Pulse: (!) 58 62  Resp: 19 19  Temp:    SpO2: 97% 98%    Last Pain:  Vitals:   05/20/18 1429  TempSrc:   PainSc: 0-No pain                 Wayde Gopaul

## 2018-05-20 NOTE — Op Note (Signed)
[facility] Gastroenterology Patient Name: Mariah Davis Procedure Date: 05/20/2018 1:37 PM MRN: [id number] Account #: [id number] Date of Birth: [birth year] Admit Type: Outpatient Age: 47 Room: [facility] ENDO ROOM 1 Gender: Female Note Status: Finalized Procedure:            Upper GI endoscopy Indications:          Generalized abdominal pain, Heartburn, Diarrhea Providers:            Scot Jun, MD Referring MD:         No Local Md, MD (Referring MD) Medicines:            Propofol per Anesthesia Complications:        No immediate complications. Procedure:            Pre-Anesthesia Assessment:                       - After reviewing the risks and benefits, the patient                        was deemed in satisfactory condition to undergo the                        procedure.                       After obtaining informed consent, the endoscope was                        passed under direct vision. Throughout the procedure,                        the patient's blood pressure, pulse, and oxygen                        saturations were monitored continuously. The Endoscope                        was introduced through the mouth, and advanced to the                        fourth part of duodenum. The upper GI endoscopy was                        accomplished without difficulty. The patient tolerated                        the procedure well. Findings:      The examined esophagus was normal.      Evidence of a Roux-en-Y gastrojejunostomy was found. This was traversed.       The pouch-to-jejunum limb was characterized by healthy appearing mucosa.       Mucosa looked great with no ulcers or inflammation. I passed the scope       deeply into the small bowel and it was normal. Impression:           - Normal esophagus.                       - Roux-en-Y gastrojejunostomy.                       - No  specimens collected. Recommendation:       - Perform a colonoscopy  today. Scot Junobert T Carlee Vonderhaar, MD 05/20/2018 1:51:21 PM This report has been signed electronically. Number of Addenda: 0 Note Initiated On: 05/20/2018 1:37 PM      [facility]

## 2018-05-20 NOTE — Anesthesia Post-op Follow-up Note (Signed)
Anesthesia QCDR form completed.        

## 2018-05-24 ENCOUNTER — Encounter: Payer: Self-pay | Admitting: Unknown Physician Specialty

## 2018-08-01 ENCOUNTER — Emergency Department
Admission: EM | Admit: 2018-08-01 | Discharge: 2018-08-01 | Disposition: A | Discharge status: Home / Self Care | Payer: BLUE CROSS/BLUE SHIELD | Attending: Emergency Medicine | Admitting: Emergency Medicine

## 2018-08-01 ENCOUNTER — Encounter: Payer: Self-pay | Admitting: Emergency Medicine

## 2018-08-01 DIAGNOSIS — F329 Major depressive disorder, single episode, unspecified: Secondary | ICD-10-CM | POA: Diagnosis not present

## 2018-08-01 DIAGNOSIS — E039 Hypothyroidism, unspecified: Secondary | ICD-10-CM | POA: Diagnosis not present

## 2018-08-01 DIAGNOSIS — F419 Anxiety disorder, unspecified: Secondary | ICD-10-CM | POA: Diagnosis not present

## 2018-08-01 DIAGNOSIS — G44201 Tension-type headache, unspecified, intractable: Secondary | ICD-10-CM | POA: Diagnosis not present

## 2018-08-01 DIAGNOSIS — Z9884 Bariatric surgery status: Secondary | ICD-10-CM | POA: Insufficient documentation

## 2018-08-01 DIAGNOSIS — R001 Bradycardia, unspecified: Secondary | ICD-10-CM

## 2018-08-01 DIAGNOSIS — Z9049 Acquired absence of other specified parts of digestive tract: Secondary | ICD-10-CM | POA: Insufficient documentation

## 2018-08-01 DIAGNOSIS — Z79899 Other long term (current) drug therapy: Secondary | ICD-10-CM | POA: Insufficient documentation

## 2018-08-01 DIAGNOSIS — R51 Headache: Secondary | ICD-10-CM | POA: Diagnosis present

## 2018-08-01 MED ORDER — KETOROLAC TROMETHAMINE 30 MG/ML IJ SOLN
30.0000 mg | Freq: Once | INTRAMUSCULAR | Status: AC
  Administered 2018-08-01: 30 mg via INTRAMUSCULAR
  Filled 2018-08-01: qty 1

## 2018-08-01 MED ORDER — CYCLOBENZAPRINE HCL 5 MG PO TABS: ORAL_TABLET | ORAL | 0 refills | Status: AC

## 2018-08-01 MED ORDER — IBUPROFEN 400 MG PO TABS: 400.0000 mg | ORAL_TABLET | Freq: Four times a day (QID) | ORAL | 0 refills | Status: AC | PRN

## 2018-08-01 MED ORDER — LEVOTHYROXINE SODIUM 125 MCG PO TABS: 125.0000 ug | ORAL_TABLET | Freq: Every day | ORAL | 1 refills | Status: AC

## 2018-08-01 NOTE — ED Triage Notes (Signed)
Pt stating HA since yesterday. Pt stating it is in her head down into her shoulders.Pt stating nausea "real bad" yesterday. Pt in NAD. No visual or auditory issues.

## 2018-08-01 NOTE — ED Provider Notes (Signed)
[facility] Emergency Department Provider Note  ____________________________________________  Time seen: Approximately 3:16 PM  I have reviewed the triage vital signs and the nursing notes.   HISTORY  Chief Complaint Migraine    HPI Mariah Davis is a 48 y.o. female that presents to the emergency department for evaluation of 2 days of headache and chills for 2 days. Patient states that headache is constant and radiates across her forehead. She also feels tension in her neck and both shoulders.  Usually her headaches are much worse than this.  She came to the emergency department today because she is concerned that she has the flu. Patient states that people have been coming into her shop sick.  She denies any nasal congestion, sore throat, cough, body aches.  Patient has also had hair falling out, dry skin, nausea for 2 weeks. Her grandkids moved in with her 2 weeks ago.  She thinks this is likely contributing to her headache.  Patient ran out of her thyroid medication 2 weeks ago.  No photophobia, visual changes, vomiting.   Past Medical History:  Diagnosis Date  . Anemia    IDA  . Anxiety   . B12 deficiency   . Depression   . Diarrhea   . GERD (gastroesophageal reflux disease)   . Headache   . Hypothyroidism   . IBS (irritable bowel syndrome)   . Sleep disorder   . Thyroid disease   . Vitamin D deficiency     Patient Active Problem List   Diagnosis Date Noted  . Iron deficiency anemia 03/01/2018    Past Surgical History:  Procedure Laterality Date  . ABDOMINAL HYSTERECTOMY    . CESAREAN SECTION    . CHOLECYSTECTOMY    . COLONOSCOPY WITH PROPOFOL N/A 05/20/2018   Procedure: COLONOSCOPY WITH PROPOFOL;  Surgeon: Scot Jun, MD;  Location: [facility] ENDOSCOPY;  Service: Endoscopy;  Laterality: N/A;  . COLPOSCOPY    . DILATION AND CURETTAGE OF UTERUS    . ESOPHAGOGASTRODUODENOSCOPY (EGD) WITH PROPOFOL N/A 05/20/2018   Procedure:  ESOPHAGOGASTRODUODENOSCOPY (EGD) WITH PROPOFOL;  Surgeon: Scot Jun, MD;  Location: [facility] ENDOSCOPY;  Service: Endoscopy;  Laterality: N/A;  . ESOPHAGOGASTRODUODENOSCOPY ENDOSCOPY    . GASTRIC BYPASS    . GASTRIC BYPASS  09/2011    Prior to Admission medications   Medication Sig Start Date End Date Taking? Authorizing Provider  citalopram (CELEXA) 20 MG tablet Take 20 mg by mouth daily. 01/20/18   [provider]  cyclobenzaprine (FLEXERIL) 5 MG tablet Take 1-2 tablets 3 times daily as needed 08/01/18   Enid Derry, PA-C  dicyclomine (BENTYL) 20 MG tablet Take 20 mg by mouth every 6 (six) hours as needed. 01/08/18 01/08/19  [provider]  ergocalciferol (VITAMIN D2) 50000 units capsule Take 50,000 Units by mouth once a week.    [provider]  ibuprofen (ADVIL,MOTRIN) 400 MG tablet Take 1 tablet (400 mg total) by mouth every 6 (six) hours as needed. 08/01/18   Enid Derry, PA-C  levothyroxine (SYNTHROID, LEVOTHROID) 125 MCG tablet Take 1 tablet (125 mcg total) by mouth daily. 08/01/18 08/01/19  Enid Derry, PA-C  loratadine (CLARITIN) 10 MG tablet Take 10 mg by mouth daily.    [provider]  montelukast (SINGULAIR) 10 MG tablet Take 10 mg by mouth daily. 01/08/18 01/08/19  [provider]  omeprazole (PRILOSEC) 20 MG capsule Take 1 capsule by mouth 2 (two) times daily. 07/20/15   [provider]  ondansetron [facility])  4 MG tablet Take 4 mg by mouth every 8 (eight) hours as needed for nausea or vomiting.    [provider]  vitamin B-12 (CYANOCOBALAMIN) 1000 MCG tablet Take 2 tablets (2,000 mcg total) by mouth daily. 03/02/18   Rickard PatienceYu, Zhou, MD    Allergies Codeine and Contrast media [iodinated diagnostic agents]  Family History  Problem Relation Age of Onset  . Stroke Mother   . COPD Mother   . Heart disease Father   . HIV Sister   . Lung cancer Paternal Aunt   . Arthritis Paternal Grandmother   . Diabetes Paternal  Grandfather   . Breast cancer Neg Hx     Social History Social History   Tobacco Use  . Smoking status: Never Smoker  . Smokeless tobacco: Never Used  Substance Use Topics  . Alcohol use: No  . Drug use: No     Review of Systems  Constitutional: No fever Cardiovascular: No chest pain. Respiratory: No SOB. Gastrointestinal: No abdominal pain.  No nausea, no vomiting.  Musculoskeletal: Negative for musculoskeletal pain. Skin: Negative for rash, abrasions, lacerations, ecchymosis. Neurological: Negative for numbness or tingling. Positive for headache.    ____________________________________________   PHYSICAL EXAM:  VITAL SIGNS: ED Triage Vitals  Enc Vitals Group     BP 08/01/18 1325 128/68     Pulse Rate 08/01/18 1325 (!) 49     Resp --      Temp 08/01/18 1325 98.4 F (36.9 C)     Temp src --      SpO2 08/01/18 1325 97 %     Weight 08/01/18 1327 211 lb (95.7 kg)     Height 08/01/18 1327 5' (1.524 m)     Head Circumference --      Peak Flow --      Pain Score 08/01/18 1326 6     Pain Loc --      Pain Edu? --      Excl. in GC? --      Constitutional: Alert and oriented. Well appearing and in no acute distress. Eyes: Conjunctivae are normal. PERRL. EOMI. Head: Atraumatic. ENT:      Ears:      Nose: No congestion/rhinnorhea.      Mouth/Throat: Mucous membranes are moist.  Neck: No stridor.  No cervical spine tenderness to palpation. Cardiovascular: Normal rate, regular rhythm.  Good peripheral circulation. Respiratory: Normal respiratory effort without tachypnea or retractions. Lungs CTAB. Good air entry to the bases with no decreased or absent breath sounds. Gastrointestinal: Bowel sounds 4 quadrants. Soft and nontender to palpation. No guarding or rigidity. No palpable masses. No distention.  Musculoskeletal: Full range of motion to all extremities. No gross deformities appreciated. Neurologic:  Normal speech and language. No gross focal neurologic  deficits are appreciated.  Skin:  Skin is warm, dry and intact. No rash noted. Psychiatric: Mood and affect are normal. Speech and behavior are normal. Patient exhibits appropriate insight and judgement.   ____________________________________________   LABS (all labs ordered are listed, but only abnormal results are displayed)  Labs Reviewed - No data to display ____________________________________________  EKG  SB ____________________________________________  RADIOLOGY   No results found.  ____________________________________________    PROCEDURES  Procedure(s) performed:    Procedures    Medications  ketorolac (TORADOL) 30 MG/ML injection 30 mg (30 mg Intramuscular Given 08/01/18 1615)     ____________________________________________   INITIAL IMPRESSION / ASSESSMENT AND PLAN / ED COURSE  Pertinent labs & imaging results that  were available during my care of the patient were reviewed by me and considered in my medical decision making (see chart for details).  Review of the [place] CSRS was performed in accordance of the [facility] prior to dispensing any controlled drugs.   Patient's diagnosis is consistent with tension headache and hypothyroidism. IM toradol was given.  Headache improved with Toradol.  Patient also has a lot of tension in her neck and shoulders and will be given a prescription for Flexeril.  Headache likely due to recent stress of grand kids moving in. Patient also has symptoms of hypothyroidsm after running out of thyroid medication 2 weeks ago.  Patient EKG shows sinus bradycardia.  Previous EKGs also show sinus bradycardia.  Patient is agreeable to follow-up with primary care regarding this.  Symptoms are unlikely due to to influenza.  Patient states that she will not take any Tamiflu so no indication for influenza testing at this time.  Patient will be discharged home with prescriptions for ibuprofen and synthroid. Patient is to follow up with PCP as  directed. Patient is given ED precautions to return to the ED for any worsening or new symptoms.     ____________________________________________  FINAL CLINICAL IMPRESSION(S) / ED DIAGNOSES  Final diagnoses:  Acute intractable tension-type headache  Hypothyroidism, unspecified type  Bradycardia      NEW MEDICATIONS STARTED DURING THIS VISIT:  ED Discharge Orders         Ordered    levothyroxine (SYNTHROID, LEVOTHROID) 125 MCG tablet  Daily     08/01/18 1643    cyclobenzaprine (FLEXERIL) 5 MG tablet     08/01/18 1643    ibuprofen (ADVIL,MOTRIN) 400 MG tablet  Every 6 hours PRN     08/01/18 1643              This chart was dictated using voice recognition software/Dragon. Despite best efforts to proofread, errors can occur which can change the meaning. Any change was purely unintentional.    Enid DerryWagner, Adrin Julian, PA-C 08/01/18 1805    Arnaldo NatalMalinda, Paul F, MD 08/02/18 [phone]

## 2018-08-01 NOTE — ED Notes (Signed)
C/o headache for a few days that radiates down into shoulder. Ambulates safely. In NAD.

## 2019-02-18 IMAGING — CR DG CHOLANGIOGRAM OPERATIVE
1 series · 1 of 1 positions shown · non-contrast
Comparison: none

REASON FOR EXAM: pt in surgery
COMMENTS:

[imported/digitized images]
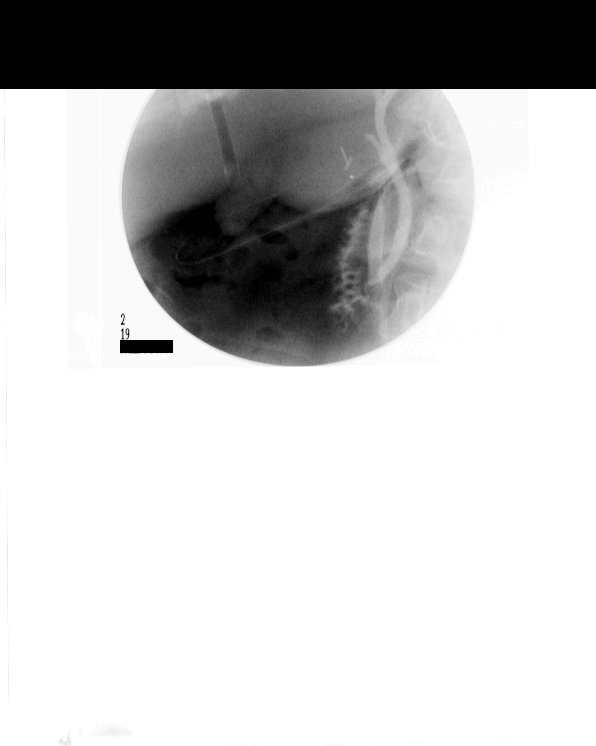

[1 of 1 positions shown; findings below may reference images not displayed]

PROCEDURE:     DXR - DXR CHOLANGIOGRAM OP (INITIAL)  - January 22, 2008  [DATE]

RESULT:     The common bile duct is opacified without evidence of filling
defects, strictural narrowing or focal outpouching.  Contrast is appreciated
draining into the duodenum.  There is no evidence of contrast extravasation
or pooling.
IMPRESSION: Intraoperative cholangiogram as described above.  The remainder of the
interpretation will be left to the performing physician.

## 2020-04-26 ENCOUNTER — Emergency Department
Admission: EM | Admit: 2020-04-26 | Discharge: 2020-04-26 | Discharge status: Against Medical Advice | Payer: BC Managed Care – PPO

## 2020-04-26 NOTE — ED Triage Notes (Signed)
Pt comes into the ED via EMS from [facility] with c/o cough with congestion, pain with palpation of the chest for the past week. VSS. Pt is a/ox4.

## 2022-01-16 ENCOUNTER — Emergency Department: Payer: BC Managed Care – PPO

## 2022-01-16 ENCOUNTER — Emergency Department
Admission: EM | Admit: 2022-01-16 | Discharge: 2022-01-16 | Disposition: A | Discharge status: Home / Self Care | Payer: BC Managed Care – PPO | Attending: Emergency Medicine | Admitting: Emergency Medicine

## 2022-01-16 ENCOUNTER — Other Ambulatory Visit: Payer: Self-pay

## 2022-01-16 DIAGNOSIS — R82998 Other abnormal findings in urine: Secondary | ICD-10-CM | POA: Diagnosis not present

## 2022-01-16 DIAGNOSIS — E039 Hypothyroidism, unspecified: Secondary | ICD-10-CM | POA: Diagnosis not present

## 2022-01-16 DIAGNOSIS — M549 Dorsalgia, unspecified: Secondary | ICD-10-CM | POA: Diagnosis present

## 2022-01-16 DIAGNOSIS — R109 Unspecified abdominal pain: Secondary | ICD-10-CM | POA: Insufficient documentation

## 2022-01-16 DIAGNOSIS — M545 Low back pain, unspecified: Secondary | ICD-10-CM | POA: Diagnosis not present

## 2022-01-16 LAB — CBC WITH DIFFERENTIAL/PLATELET
Abs Immature Granulocytes: 0.03 10*3/uL (ref 0.00–0.07)
Basophils Absolute: 0.1 10*3/uL (ref 0.0–0.1)
Basophils Relative: 1 %
Eosinophils Absolute: 0.5 10*3/uL (ref 0.0–0.5)
Eosinophils Relative: 4 %
HCT: 35 % — ABNORMAL LOW (ref 36.0–46.0)
Hemoglobin: 10.7 g/dL — ABNORMAL LOW (ref 12.0–15.0)
Immature Granulocytes: 0 %
Lymphocytes Relative: 28 %
Lymphs Abs: 2.9 10*3/uL (ref 0.7–4.0)
MCH: 25.2 pg — ABNORMAL LOW (ref 26.0–34.0)
MCHC: 30.6 g/dL (ref 30.0–36.0)
MCV: 82.5 fL (ref 80.0–100.0)
Monocytes Absolute: 0.7 10*3/uL (ref 0.1–1.0)
Monocytes Relative: 7 %
Neutro Abs: 6.2 10*3/uL (ref 1.7–7.7)
Neutrophils Relative %: 60 %
Platelets: 268 10*3/uL (ref 150–400)
RBC: 4.24 MIL/uL (ref 3.87–5.11)
RDW: 14.8 % (ref 11.5–15.5)
WBC: 10.4 10*3/uL (ref 4.0–10.5)
nRBC: 0 % (ref 0.0–0.2)

## 2022-01-16 LAB — URINALYSIS, ROUTINE W REFLEX MICROSCOPIC
Bilirubin Urine: NEGATIVE
Glucose, UA: NEGATIVE mg/dL
Hgb urine dipstick: NEGATIVE
Ketones, ur: NEGATIVE mg/dL
Nitrite: NEGATIVE
Protein, ur: NEGATIVE mg/dL
Specific Gravity, Urine: 1.01 (ref 1.005–1.030)
pH: 6 (ref 5.0–8.0)

## 2022-01-16 LAB — BASIC METABOLIC PANEL
Anion gap: 5 (ref 5–15)
BUN: 14 mg/dL (ref 6–20)
CO2: 26 mmol/L (ref 22–32)
Calcium: 8.9 mg/dL (ref 8.9–10.3)
Chloride: 112 mmol/L — ABNORMAL HIGH (ref 98–111)
Creatinine, Ser: 0.84 mg/dL (ref 0.44–1.00)
GFR, Estimated: >60 mL/min (ref >60)
Glucose, Bld: 99 mg/dL (ref 70–99)
Potassium: 3.9 mmol/L (ref 3.5–5.1)
Sodium: 143 mmol/L (ref 135–145)

## 2022-01-16 LAB — URINALYSIS, MICROSCOPIC (REFLEX)

## 2022-01-16 MED ORDER — TRAMADOL HCL 50 MG PO TABS: 50.0000 mg | ORAL_TABLET | Freq: Four times a day (QID) | ORAL | 0 refills | Status: AC | PRN

## 2022-01-16 MED ORDER — BACLOFEN 10 MG PO TABS: 10.0000 mg | ORAL_TABLET | Freq: Three times a day (TID) | ORAL | 0 refills | Status: AC

## 2022-01-16 MED ORDER — SODIUM CHLORIDE 0.9 % IV BOLUS
1000.0000 mL | Freq: Once | INTRAVENOUS | Status: AC
  Administered 2022-01-16: 1000 mL via INTRAVENOUS

## 2022-01-16 MED ORDER — MORPHINE SULFATE (PF) 4 MG/ML IV SOLN
4.0000 mg | Freq: Once | INTRAVENOUS | Status: AC
  Administered 2022-01-16: 4 mg via INTRAVENOUS
  Filled 2022-01-16: qty 1

## 2022-01-16 MED ORDER — CEFDINIR 300 MG PO CAPS: 300.0000 mg | ORAL_CAPSULE | Freq: Two times a day (BID) | ORAL | 0 refills | Status: AC

## 2022-01-16 MED ORDER — ONDANSETRON HCL 4 MG/2ML IJ SOLN
4.0000 mg | Freq: Once | INTRAMUSCULAR | Status: AC
  Administered 2022-01-16: 4 mg via INTRAVENOUS
  Filled 2022-01-16: qty 2

## 2022-01-16 NOTE — Discharge Instructions (Signed)
Follow-up with your regular doctor if not improving in 2 to 3 days.  Return emergency department worsening.  Take your medications as prescribed

## 2022-01-16 NOTE — ED Provider Notes (Signed)
[facility] Provider Note    Event Date/Time   First MD Initiated Contact with Patient 01/16/22 1347     (approximate)   History   Back Pain   HPI  Mariah Davis is a 51 y.o. female with history of kidney stones, hypothyroidism, vitamin D deficiency IBS and B12 deficiency presents emergency department complaining of left-sided flank pain.  Patient states she was in the shower when she bent over she felt a sharp pain all across her back.  It has now settled into the left flank.  States similar somewhat to the kidney stone she had previously.  No vomiting.  States pain is not reproduced with movement but is worse when she is sitting.      Physical Exam   Triage Vital Signs: ED Triage Vitals  Enc Vitals Group     BP 01/16/22 1254 (!) 144/80     Pulse Rate 01/16/22 1253 67     Resp 01/16/22 1253 16     Temp 01/16/22 1253 97.7 F (36.5 C)     Temp Source 01/16/22 1253 Oral     SpO2 01/16/22 1253 99 %     Weight 01/16/22 1254 211 lb (95.7 kg)     Height 01/16/22 1254 5' (1.524 m)     Head Circumference --      Peak Flow --      Pain Score 01/16/22 1254 10     Pain Loc --      Pain Edu? --      Excl. in GC? --     Most recent vital signs: Vitals:   01/16/22 1253 01/16/22 1254  BP:  (!) 144/80  Pulse: 67   Resp: 16   Temp: 97.7 F (36.5 C)   SpO2: 99%      General: Awake, no distress.   CV:  Good peripheral perfusion. regular rate and  rhythm Resp:  Normal effort.  Abd:  No distention.  Left flank tender to palpation Other:      ED Results / Procedures / Treatments   Labs (all labs ordered are listed, but only abnormal results are displayed) Labs Reviewed  URINALYSIS, ROUTINE W REFLEX MICROSCOPIC - Abnormal; Notable for the following components:      Result Value   Leukocytes,Ua SMALL (*)    All other components within normal limits  URINALYSIS, MICROSCOPIC (REFLEX) - Abnormal; Notable for the following components:    Bacteria, UA FEW (*)    All other components within normal limits  BASIC METABOLIC PANEL - Abnormal; Notable for the following components:   Chloride 112 (*)    All other components within normal limits  CBC WITH DIFFERENTIAL/PLATELET - Abnormal; Notable for the following components:   Hemoglobin 10.7 (*)    HCT 35.0 (*)    MCH 25.2 (*)    All other components within normal limits     EKG     RADIOLOGY CT renal stone    PROCEDURES:   Procedures   MEDICATIONS ORDERED IN ED: Medications  sodium chloride 0.9 % bolus 1,000 mL (1,000 mLs Intravenous New Bag/Given 01/16/22 1431)  morphine (PF) 4 MG/ML injection 4 mg (4 mg Intravenous Given 01/16/22 1431)  ondansetron (ZOFRAN) injection 4 mg (4 mg Intravenous Given 01/16/22 1431)     IMPRESSION / MDM / ASSESSMENT AND PLAN / ED COURSE  I reviewed the triage vital signs and the nursing notes.  Differential diagnosis includes, but is not limited to, kidney stone, muscle strain, compression fracture, diverticulitis, pyelonephritis  Patient's presentation is most consistent with acute complicated illness / injury requiring diagnostic workup.   Labs and imaging ordered.  Patient urinalysis shows small amount of leuks with few bacteria and there are red blood cells.  However do have concerns for pyelo or kidney stone.  UA has small amount of white blood cells, CBC and metabolic panel are in the patient's normal trend  CT renal stone study independently reviewed and interpreted by me as being negative for stone.  Confirmed by radiology  I did explain these findings to the patient.  I do feel she has more of a muscle strain.  She was given a work note.  Prescription for tramadol as she has had gastric bypass surgery and should not do NSAIDs, also gave her prescription for baclofen.  She is to follow-up with orthopedics or her regular doctor.  She was discharged stable condition.    FINAL CLINICAL  IMPRESSION(S) / ED DIAGNOSES   Final diagnoses:  Acute left-sided low back pain without sciatica     Rx / DC Orders   ED Discharge Orders          Ordered    baclofen (LIORESAL) 10 MG tablet  3 times daily        01/16/22 1504    traMADol (ULTRAM) 50 MG tablet  Every 6 hours PRN        01/16/22 1504    cefdinir (OMNICEF) 300 MG capsule  2 times daily        01/16/22 1504             Note:  This document was prepared using Dragon voice recognition software and may include unintentional dictation errors.    Faythe Ghee, PA-C 01/16/22 1505    Merwyn Katos, MD 01/16/22 [phone]

## 2022-01-16 NOTE — ED Provider Triage Note (Signed)
Emergency Medicine Provider Triage Evaluation Note  Mariah Davis , a 52 y.o. female  was evaluated in triage.  Pt complains of low back pain while in the shower.  Pt.bent over and felt pain. Initially was bilaterally but now left sided.  Hx of stones.  + Nausea but no vomiting.  Review of Systems  Positive: + nausea, slight dysuria and darker than usual. Negative: No fever, chills or frank hematuria  Physical Exam  Pulse 67   Temp 97.7 F (36.5 C) (Oral)   Resp 16   SpO2 99%  Gen:   Awake, no distress   Resp:  Normal effort  MSK:   Moves extremities without difficulty  Other:  Left flank tenderness to percussion.  Medical Decision Making  Medically screening exam initiated at 12:54 PM.  Appropriate orders placed.  Mariah Davis was informed that the remainder of the evaluation will be completed by another provider, this initial triage assessment does not replace that evaluation, and the importance of remaining in the ED until their evaluation is complete.     Tommi Rumps, PA-C 01/16/22 1306

## 2022-01-16 NOTE — ED Triage Notes (Signed)
Pt states she was bending down in the shower and had sudden onset left lower back pain .
